# Patient Record
Sex: Female | Born: 1960 | Race: White | Hispanic: No | Marital: Married | Smoking: Never smoker
Health system: Southern US, Community
[De-identification: ages and names within clinical notes are randomized; demographics above are authoritative.]

## PROBLEM LIST (undated history)

## (undated) DIAGNOSIS — M810 Age-related osteoporosis without current pathological fracture: Secondary | ICD-10-CM

## (undated) DIAGNOSIS — N951 Menopausal and female climacteric states: Secondary | ICD-10-CM

## (undated) DIAGNOSIS — R011 Cardiac murmur, unspecified: Secondary | ICD-10-CM

## (undated) HISTORY — DX: Cardiac murmur, unspecified: R01.1

## (undated) HISTORY — DX: Menopausal and female climacteric states: N95.1

## (undated) HISTORY — DX: Age-related osteoporosis without current pathological fracture: M81.0

## (undated) HISTORY — PX: BREAST EXCISIONAL BIOPSY: SUR124

## (undated) HISTORY — PX: HYSTEROPLASTY REPAIR OF UTERINE ANOMALY: SUR663

## (undated) HISTORY — PX: COLONOSCOPY: SHX174

---

## 1987-01-12 HISTORY — PX: BREAST CYST EXCISION: SHX579

## 2003-05-13 ENCOUNTER — Other Ambulatory Visit: Admission: RE | Admit: 2003-05-13 | Discharge: 2003-05-13 | Payer: Self-pay | Admitting: Obstetrics and Gynecology

## 2003-05-17 ENCOUNTER — Encounter: Admission: RE | Admit: 2003-05-17 | Discharge: 2003-05-17 | Payer: Self-pay | Admitting: Obstetrics and Gynecology

## 2003-05-17 ENCOUNTER — Encounter (INDEPENDENT_AMBULATORY_CARE_PROVIDER_SITE_OTHER): Payer: Self-pay | Admitting: *Deleted

## 2003-07-11 ENCOUNTER — Ambulatory Visit (HOSPITAL_COMMUNITY): Admission: RE | Admit: 2003-07-11 | Discharge: 2003-07-11 | Payer: Self-pay | Admitting: Obstetrics and Gynecology

## 2003-07-11 ENCOUNTER — Encounter (INDEPENDENT_AMBULATORY_CARE_PROVIDER_SITE_OTHER): Payer: Self-pay | Admitting: Specialist

## 2004-01-12 HISTORY — PX: CERVICAL DISC ARTHROPLASTY: SHX587

## 2004-03-09 ENCOUNTER — Ambulatory Visit (HOSPITAL_COMMUNITY): Admission: RE | Admit: 2004-03-09 | Discharge: 2004-03-10 | Payer: Self-pay | Admitting: Neurosurgery

## 2004-06-04 ENCOUNTER — Encounter: Admission: RE | Admit: 2004-06-04 | Discharge: 2004-06-04 | Payer: Self-pay | Admitting: Obstetrics and Gynecology

## 2005-06-08 ENCOUNTER — Encounter: Admission: RE | Admit: 2005-06-08 | Discharge: 2005-06-08 | Payer: Self-pay | Admitting: Obstetrics and Gynecology

## 2005-12-11 HISTORY — PX: HYSTEROSCOPY: SHX211

## 2006-01-21 ENCOUNTER — Encounter: Admission: RE | Admit: 2006-01-21 | Discharge: 2006-01-21 | Payer: Self-pay | Admitting: Neurosurgery

## 2006-06-14 ENCOUNTER — Encounter: Admission: RE | Admit: 2006-06-14 | Discharge: 2006-06-14 | Payer: Self-pay | Admitting: Obstetrics and Gynecology

## 2006-12-13 ENCOUNTER — Ambulatory Visit (HOSPITAL_COMMUNITY): Admission: RE | Admit: 2006-12-13 | Discharge: 2006-12-13 | Payer: Self-pay | Admitting: Obstetrics and Gynecology

## 2006-12-13 ENCOUNTER — Encounter (INDEPENDENT_AMBULATORY_CARE_PROVIDER_SITE_OTHER): Payer: Self-pay | Admitting: Obstetrics and Gynecology

## 2007-01-12 HISTORY — PX: CERVICAL DISC ARTHROPLASTY: SHX587

## 2007-02-01 ENCOUNTER — Ambulatory Visit (HOSPITAL_COMMUNITY): Admission: RE | Admit: 2007-02-01 | Discharge: 2007-02-02 | Payer: Self-pay | Admitting: Neurosurgery

## 2007-06-16 ENCOUNTER — Encounter: Admission: RE | Admit: 2007-06-16 | Discharge: 2007-06-16 | Payer: Self-pay | Admitting: Obstetrics and Gynecology

## 2007-11-16 ENCOUNTER — Other Ambulatory Visit: Admission: RE | Admit: 2007-11-16 | Discharge: 2007-11-16 | Payer: Self-pay | Admitting: Obstetrics and Gynecology

## 2008-06-17 ENCOUNTER — Encounter: Admission: RE | Admit: 2008-06-17 | Discharge: 2008-06-17 | Payer: Self-pay | Admitting: Obstetrics and Gynecology

## 2008-09-27 ENCOUNTER — Encounter: Admission: RE | Admit: 2008-09-27 | Discharge: 2008-09-27 | Payer: Self-pay | Admitting: Neurosurgery

## 2009-02-24 DIAGNOSIS — M1990 Unspecified osteoarthritis, unspecified site: Secondary | ICD-10-CM | POA: Insufficient documentation

## 2009-04-22 ENCOUNTER — Encounter (INDEPENDENT_AMBULATORY_CARE_PROVIDER_SITE_OTHER): Payer: Self-pay | Admitting: *Deleted

## 2009-06-05 ENCOUNTER — Encounter (INDEPENDENT_AMBULATORY_CARE_PROVIDER_SITE_OTHER): Payer: Self-pay | Admitting: *Deleted

## 2009-06-10 ENCOUNTER — Ambulatory Visit: Payer: Self-pay | Admitting: Internal Medicine

## 2009-06-18 ENCOUNTER — Ambulatory Visit: Payer: Self-pay | Admitting: Internal Medicine

## 2009-06-22 ENCOUNTER — Encounter: Payer: Self-pay | Admitting: Internal Medicine

## 2009-06-24 ENCOUNTER — Encounter: Admission: RE | Admit: 2009-06-24 | Discharge: 2009-06-24 | Payer: Self-pay | Admitting: Obstetrics and Gynecology

## 2009-10-06 ENCOUNTER — Encounter: Admission: RE | Admit: 2009-10-06 | Discharge: 2009-10-06 | Payer: Self-pay | Admitting: Neurosurgery

## 2009-11-15 DIAGNOSIS — N951 Menopausal and female climacteric states: Secondary | ICD-10-CM

## 2009-11-15 HISTORY — DX: Menopausal and female climacteric states: N95.1

## 2010-02-10 NOTE — Letter (Signed)
Summary: Previsit letter  [facility]  [address] [place], [place] [address]   Phone: [phone]  Fax: [phone]       04/22/2009 MRN: [id number]  [facility] [address] [place], [place]  [address]  Dear [name],  Welcome to the Gastroenterology Division at [facility].    You are scheduled to see a nurse for your pre-procedure visit on 06-10-09 at 8:00a.m. on the 3rd floor at [facility], [address]  We ask that you try to arrive at our office 15 minutes prior to your appointment time to allow for check-in.  Your nurse visit will consist of discussing your medical and surgical history, your immediate family medical history, and your medications.    Please bring a complete list of all your medications or, if you prefer, bring the medication bottles and we will list them.  We will need to be aware of both prescribed and over the counter drugs.  We will need to know exact dosage information as well.  If you are on blood thinners (Coumadin, Plavix, Aggrenox, Ticlid, etc.) please call our office today/prior to your appointment, as we need to consult with your physician about holding your medication.   Please be prepared to read and sign documents such as consent forms, a financial agreement, and acknowledgement forms.  If necessary, and with your consent, a friend or relative is welcome to sit-in on the nurse visit with you.  Please bring your insurance card so that we may make a copy of it.  If your insurance requires a referral to see a specialist, please bring your referral form from your primary care physician.  No co-pay is required for this nurse visit.     If you cannot keep your appointment, please call [phone] to cancel or reschedule prior to your appointment date.  This allows [place] the opportunity to schedule an appointment for another patient in need of care.    Thank you for choosing [facility] for your medical  needs.  We appreciate the opportunity to care for you.  Please visit [place] at our website  to learn more about our practice.                     Sincerely.                                                                                                                   The Gastroenterology Division

## 2010-02-10 NOTE — Letter (Signed)
Summary: Moviprep Instructions  [facility]  [address]   [place], [place] [address]   Phone: [phone]  Fax: [phone]       [name]    03/06/2009    MRN: [id number]        Procedure Day Dorna Bloom: Wednesday, 06-18-09     Arrival Time: 7:30 a.m.     Procedure Time: 8:00 a.m.     Location of Procedure:                    x   [facility] (4th Floor)   PREPARATION FOR COLONOSCOPY WITH MOVIPREP   Starting 5 days prior to your procedure 51-3-11  do not eat nuts, seeds, popcorn, corn, beans, peas,  salads, or any raw vegetables.  Do not take any fiber supplements (e.g. Metamucil, Citrucel, and Benefiber).  THE DAY BEFORE YOUR PROCEDURE         DATE:  51-7-11   DAY: Tuesday  1.  Drink clear liquids the entire day-NO SOLID FOOD  2.  Do not drink anything colored red or purple.  Avoid juices with pulp.  No orange juice.  3.  Drink at least 64 oz. (8 glasses) of fluid/clear liquids during the day to prevent dehydration and help the prep work efficiently.  CLEAR LIQUIDS INCLUDE: Water Jello Ice Popsicles Tea (sugar ok, no milk/cream) Powdered fruit flavored drinks Coffee (sugar ok, no milk/cream) Gatorade Juice: apple, white grape, white cranberry  Lemonade Clear bullion, consomm, broth Carbonated beverages (any kind) Strained chicken noodle soup Hard Candy                             4.  In the morning, mix first dose of MoviPrep solution:    Empty 1 Pouch A and 1 Pouch B into the disposable container    Add lukewarm drinking water to the top line of the container. Mix to dissolve    Refrigerate (mixed solution should be used within 24 hrs)  5.  Begin drinking the prep at 5:00 p.m. The MoviPrep container is divided by 4 marks.   Every 15 minutes drink the solution down to the next mark (approximately 8 oz) until the full liter is complete.   6.  Follow completed prep with 16 oz of clear liquid of your choice (Nothing red or purple).   Continue to drink clear liquids until bedtime.  7.  Before going to bed, mix second dose of MoviPrep solution:    Empty 1 Pouch A and 1 Pouch B into the disposable container    Add lukewarm drinking water to the top line of the container. Mix to dissolve    Refrigerate  THE DAY OF YOUR PROCEDURE      DATE:  51-8-11  DAY: Wednesday  Beginning at 3:00 a.m. (5 hours before procedure):         1. Every 15 minutes, drink the solution down to the next mark (approx 8 oz) until the full liter is complete.  2. Follow completed prep with 16 oz. of clear liquid of your choice.    3. You may drink clear liquids until 6:00 a.m.  (2 HOURS BEFORE PROCEDURE).   MEDICATION INSTRUCTIONS  Unless otherwise instructed, you should take regular prescription medications with a small sip of water   as early as possible the morning of your procedure.        OTHER INSTRUCTIONS  You will need  a responsible adult at least 51 years of age to accompany you and drive you home.   This person must remain in the waiting room during your procedure.  Wear loose fitting clothing that is easily removed.  Leave jewelry and other valuables at home.  However, you may wish to bring a book to read or  an iPod/MP3 player to listen to music as you wait for your procedure to start.  Remove all body piercing jewelry and leave at home.  Total time from sign-in until discharge is approximately 2-3 hours.  You should go home directly after your procedure and rest.  You can resume normal activities the  day after your procedure.  The day of your procedure you should not:   Drive   Make legal decisions   Operate machinery   Drink alcohol   Return to work  You will receive specific instructions about eating, activities and medications before you leave.    The above instructions have been reviewed and explained to me by   Ezra Sites RN  Jun 10, 2009 8:12 AM    I fully understand and can verbalize these  instructions _____________________________ Date _________

## 2010-02-10 NOTE — Miscellaneous (Signed)
Summary: [facility]  Clinical Lists Changes  Medications: Added new medication of MOVIPREP 100 GM  SOLR (PEG-KCL-NACL-NASULF-NA ASC-C) As per prep instructions. - Signed Rx of MOVIPREP 100 GM  SOLR (PEG-KCL-NACL-NASULF-NA ASC-C) As per prep instructions.;  #1 x 0;  Signed;  Entered by: Ezra Sites RN;  Authorized by: Hilarie Fredrickson MD;  Method used: Electronically to [facility] # [phone]*, [address] [place], [place], [place]  [address], Ph: [phone], Fax: [phone] Allergies: Added new allergy or adverse reaction of * FINACEA Observations: Added new observation of NKA: F (06/10/2009 7:47)    Prescriptions: MOVIPREP 100 GM  SOLR (PEG-KCL-NACL-NASULF-NA ASC-C) As per prep instructions.  #1 x 0   Entered by:   Ezra Sites RN   Authorized by:   Hilarie Fredrickson MD   Signed by:   Ezra Sites RN on 06/10/2009   Method used:   Electronically to        [facility] [phone]* (retail)       [address] [place], [place]  [address]       Ph: [phone]       Fax: [phone]   RxID:   [id number]

## 2010-02-10 NOTE — Procedures (Signed)
Summary: Colonoscopy  Patient: [name] Note: All result statuses are Final unless otherwise noted.  Tests: (1) Colonoscopy (COL)   COL Colonoscopy           DONE     [facility]     [address]     [place], [place]  [address]           COLONOSCOPY PROCEDURE REPORT           PATIENT:  [name], [name]  MR#:  [id number]     BIRTHDATE:  December 09, 1960, 49 yrs. old  GENDER:  female     ENDOSCOPIST:  [name]. Eda Keys, MD     REF. BY:  [name], M.D.     PROCEDURE DATE:  06/18/2009     PROCEDURE:  Colonoscopy with snare polypectomy x 1     ASA CLASS:  Class I     INDICATIONS:  Routine Risk Screening     MEDICATIONS:   Fentanyl 100 mcg IV, Versed 10 mg IV           DESCRIPTION OF PROCEDURE:   After the risks benefits and     alternatives of the procedure were thoroughly explained, informed     consent was obtained.  Digital rectal exam was performed and     revealed no abnormalities.   The LB CF-H180AL [id number] endoscope     was introduced through the anus and advanced to the cecum, which     was identified by both the appendix and ileocecal valve, without     limitations.Time to cecum = 3:26 min. The quality of the prep was     excellent, using MoviPrep.  The instrument was then slowly     withdrawn (time = 11:26 min) as the colon was fully examined.     <<PROCEDUREIMAGES>>           FINDINGS:  A diminutive polyp was found in the cecum. Polyp was     snared without cautery. Retrieval was successful. snare polyp     This was otherwise a normal examination of the colon.   Retroflexed     views in the rectum revealed no abnormalities.    The scope was     then withdrawn from the patient and the procedure completed.           COMPLICATIONS:  None     ENDOSCOPIC IMPRESSION:     1) Diminutive polyp in the cecum - removed     2) Otherwise normal examination           RECOMMENDATIONS:     1) Repeat colonoscopy in 5 years if polyp adenomatous; otherwise     10 years       ______________________________     [name]. Eda Keys, MD           CC:  [name], MD; The Patient           n.     eSIGNED:   [name]. Eda Keys at 06/18/2009 08:44 AM           Lenise Herald, [id number]  Note: An exclamation mark (!) indicates a result that was not dispersed into the flowsheet. Document Creation Date: 06/18/2009 8:45 AM _______________________________________________________________________  (1) Order result status: Final Collection or observation date-time: 06/18/2009 08:35 Requested date-time:  Receipt date-time:  Reported date-time:  Referring Physician:   Ordering Physician: Fransico Setters [phone]) Specimen Source:  Source: Launa Grill Order Number: [id number]  Lab site:   Appended Document: Colonoscopy recall     Procedures Next Due Date:    Colonoscopy: 06/2019

## 2010-02-10 NOTE — Letter (Signed)
Summary: Patient Notice- Polyp Results  [facility]  [address] [place], [place] [address]   Phone: [phone]  Fax: [phone]        June 22, 2009 MRN: [id number]    [facility] [address] [place], [place]  [address]    Dear Ms. Picinich,  I am pleased to inform you that the colon polyp(s) removed during your recent colonoscopy was (were) found to be benign (no cancer detected) upon pathologic examination.  I recommend you have a repeat colonoscopy examination in 10 years to look for recurrent polyps, as having colon polyps increases your risk for having recurrent polyps or even colon cancer in the future.  Should you develop new or worsening symptoms of abdominal pain, bowel habit changes or bleeding from the rectum or bowels, please schedule an evaluation with either your primary care physician or with me.  Additional information/recommendations:  __ No further action with gastroenterology is needed at this time. Please      follow-up with your primary care physician for your other healthcare      needs.   Please call us if you are having persistent problems or have questions about your condition that have not been fully answered at this time.  Sincerely,  Hilarie Fredrickson MD  This letter has been electronically signed by your physician.  Appended Document: Patient Notice- Polyp Results letter mailed.

## 2010-05-15 DIAGNOSIS — G4700 Insomnia, unspecified: Secondary | ICD-10-CM | POA: Insufficient documentation

## 2010-05-15 DIAGNOSIS — M810 Age-related osteoporosis without current pathological fracture: Secondary | ICD-10-CM | POA: Insufficient documentation

## 2010-05-26 NOTE — Op Note (Signed)
[name], [name]               ACCOUNT NO.:  [id number]   MEDICAL RECORD NO.:  [id number]          PATIENT TYPE:  AMB   LOCATION:  SDC                           FACILITY:  [facility]   PHYSICIAN:  Lenoard Aden, M.D.DATE OF BIRTH:  1960/06/21   DATE OF PROCEDURE:  12/13/2006  DATE OF DISCHARGE:                               OPERATIVE REPORT   PREOPERATIVE DIAGNOSIS:  Perimenopausal dysfunctional  uterine bleeding  with probable structural lesion.   POSTOPERATIVE DIAGNOSIS:  Multiple endometrial polyps.   PROCEDURE:  1. Diagnostic hysteroscopy resectoscope with multiple polypectomy.  2. Dilatation and curettage.   SURGEON:  Lenoard Aden, M.D.   ANESTHESIA:  General.   BLOOD LOSS:  Less than 50 mL.   COMPLICATIONS:  None.   DRAINS:  Foley.   COUNTS:  Correct.   CONDITION:  Recovery in good condition.   FLUID DEFICIT:  100 mL.   DESCRIPTION OF PROCEDURE:  After being apprised of the risks of  anesthesia, infection, bleeding, uterine perforation, possible need for  repair, the patient brought to the operating room.  She was administered  general anesthetic without complications, prepped and draped in sterile  fashion,  catheterized until the bladder was empty.  Exam under  anesthesia reveals a small anteflexed uterus.  No adnexal masses.  Dilute Pitressin solution, 2700 mL, was placed, 16 mL total at the  cervicovaginal junction at 9 and 3 o'clock in the standard fashion and  then a paracervical block was placed using 1% Nesacaine solution at 4  and 8 o'clock in standard fashion.  Cervix is easily dilated up to a #27  Pratt dilator.  Hysteroscope placed.  Visualization reveals multiple  polyps along the anterior and posterior wall in addition to some pseudo  polypoid areas along the left lateral area.  The resectoscope was  placed.  A right-angle loop is used to resect all areas completely with  good hemostasis.  Bilateral normal tubal ostia noted.  Normal uterine  cavity is noted.  Dilatation and curettage was performed using sharp  curettage in the four- quadrant  method.  Good hemostasis noted.  Revisualization reveals no evidence of  further polypoid growth.  No bleeding is noted.  The patient all  instruments were removed.  The patient tolerates the procedure well and  is transferred to recovery in good condition,      Lenoard Aden, M.D.  Electronically Signed     RJT/MEDQ  D:  12/13/2006  T:  12/13/2006  Job:  [id number]

## 2010-05-26 NOTE — Op Note (Signed)
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   PROCEDURE:  C6-7 anterior cervical decompression and arthrodesis with  Allograft and Tether cervical plating.   SURGEON:  Hewitt Shorts, M.D.   ASSISTANT:  Nelia Shi. Webb Silversmith, N.P., and Stefani Dama, M.D.   ANESTHESIA:  General endotracheal.   INDICATIONS:  The patient is a 50 year old woman who is nearly 3 years  status post a 2-level C5-6 and C6-7 anterior cervical discectomy and  arthrodesis with Allograft and Tether cervical plating.  She did well  but then began to develop neck and right upper extremity radicular pain.  X-rays and CT showed excellent fusion at the C5-6 level but a nonunion  at C6-7 and because of persistent symptoms, a decision was made to  proceed with revision of the arthrodesis at C6-7.   PROCEDURE:  The patient was brought to the operating room and placed  under general endotracheal anesthesia.  The patient was placed in 10  pounds of Holter traction.  The neck was then prepped with Betadine soap  and solution and draped in a sterile fashion.  The previous incision was  marked and the underlying subcutaneous tissues were infiltrated with  local anesthetic with epinephrine, and the previous incision was  reopened.  Then, dissection was carried down through the subcutaneous  tissue and platysma.  Bipolar cautery was used to maintain hemostasis.  Dissection was then carried out through an  avascular plane leaving the  sternocleidomastoid, carotid artery, and jugular vein laterally, and  trachea and esophagus medially.  The ventral aspect of the vertebral  column was identified.  The previous plate was identified.  We carefully  dissected the overlying scar tissue exposing the plate.  We removed the  screws and plate and identified the pseudoarthrosis at C6-7.  The  microscope was draped and brought into the field to provide additional  navigation, illumination, and visualization.  The remainder of the  decompression was performed using microdissection and microsurgical  technique.  The scar tissue from the anterior aspects of the vertebral  bodies at C6 and C7 was removed down to the bony surface.  We then  entered into the disc space; there was extensive scar tissue.  This was  carefully removed with microcurettes and the X-Max drill.  We distracted  the disc space and continued to remove the pseudoarthrosis including  scar and bone and exposed down to the bony surfaces of C6 and C7.  Dissection was carried out posteriorly.  There was spondylitic  overgrowth posteriorly.  This was carefully removed with a 2-mm Kerrison  punch with a thin footplate.  We were  able to dissect and remove the  scar tissue from the ventral aspect of the spinal canal exposing and  decompressing the thecal sac, and then our attention was turned to the  neural foramen, which were decompressed bilaterally, and all the scar  tissue was removed, and the C7 nerve root was decompressed within the  neural foramen.  The wound was irrigated with bacitracin solution.  Hemostasis was established with the use of Gelfoam soaked in thrombin.  We measured the height of the intervertebral disc space.  We selected a  7-mm allograft implant.  The Gelfoam was removed.  Hemostasis was  confirmed and then we carefully positioned the intravertebral graft, and  it was countersunk.  We then took [employer]aTech ABX Putty, a  synthetic bone  graft and packed it around the lateral aspects of the structural  allograft.  Then, we selected a 14-mm Tether cervical plate that was  positioned over the fusion construct, the cervical traction was  discontinued.  We used 4.5- x 13-mm screws in the C7 and 4.0- x 13-mm  screws in C6.  We used the old screw holes at C7.  The plate was  positioned over the construct, and the 4.5-mm screws were placed at C7.  We then drilled screw holes at C6 and placed a 4- x 13-mm screws at that  level.  Variable-angle screws were used at both levels.  All 4 screws  were tightened with a final tightening once they were placed.  An x-ray  was taken, which showed the graft, plate, and screws in good position.  The alignment was good.  The wound was irrigated with bacitracin  solution and checked for hemostasis, which was confirmed and then we  proceeded with closure.  The platysma was closed with interrupted  inverted 2-0 running Vicryl sutures.  Subcutaneous and subcuticular  layer closed with interrupted inverted 3-0 running Vicryl sutures.  The  skin was reapproximated with Dermabond.  The procedure was tolerated  well.  The estimated blood loss was less than 25 mL.  Sponge and needle  counts were correct.  Following the surgery, the patient was placed in  an Aspen cervical collar to be reversed from the anesthetic, extubated,  and transferred to the recovery room for further care.      Hewitt Shorts, M.D.  Electronically Signed     RWN/MEDQ  D:  02/01/2007  T:  02/01/2007  Job:  [id number]

## 2010-05-27 ENCOUNTER — Other Ambulatory Visit: Payer: Self-pay | Admitting: Obstetrics and Gynecology

## 2010-05-27 DIAGNOSIS — Z1231 Encounter for screening mammogram for malignant neoplasm of breast: Secondary | ICD-10-CM

## 2010-05-29 NOTE — H&P (Signed)
NAME:  [name], [name]                         ACCOUNT NO.:  [id number]   MEDICAL RECORD NO.:  [id number]                   PATIENT TYPE:  AMB   LOCATION:  SDC                                  [facility]   PHYSICIAN:  Richardean Sale, M.D.                DATE OF BIRTH:  Oct 30, 1960   DATE OF ADMISSION:  DATE OF DISCHARGE:                                HISTORY & PHYSICAL   DATE OF PLANNED PROCEDURE:  July 11, 2003   CHIEF COMPLAINT:  Menorrhagia.   HISTORY OF PRESENT ILLNESS:  This is a 50 year old white female gravida 1  para 1 who has a long history of heavy menses that last approximately 6  days.  She has cramping and they are significantly heavy.  She also has  intermenstrual spotting.  She underwent ultrasound which revealed a normally-  sized uterus.  However, the endometrial lining was 0.57 cm with an  identifiable echogenic mass.  Both ovaries were within normal limits and  there is no cul-de-sac fluid.  She presents today for hysteroscopy D&C,  possible polypectomy.  She does have a history of anemia secondary to heavy  menses.  Last hemoglobin was 12.9 in May 2005.  She has had coagulation  studies drawn and those are normal.   PAST MEDICAL HISTORY:  1. Anemia.  2. Heart murmur which does not require antibiotics during procedures.  3. Thyroid cyst.   SURGICAL HISTORY:  1. Foot surgery.  2. Right breast cyst removal.  3. Right breast biopsy for fibroadenoma.  4. Removal of benign growth above right eye.   MEDICATIONS:  Multivitamin and iron, Finacea, and Retinae.   ALLERGIES:  None.   SOCIAL HISTORY:  No tobacco, rare alcohol, no drugs.   FAMILY HISTORY:  Mother has thyroid disease.  Father has heart disease,  hypertension.  Two brothers have hypertension.  She has a sister with  diabetes.  No history of breast, ovarian, uterine, or colon cancer.   PHYSICAL EXAMINATION:  VITAL SIGNS:  She is afebrile and vital signs are  stable.  GENERAL:  She is a thin  white female who is in no apparent distress who  appears her stated age.  NECK:  Neck and thyroid are within normal limits.  HEART:  Regular rate and rhythm.  No murmurs, rubs, or gallops auscultated  today.  LUNGS:  Clear to auscultation bilaterally.  ABDOMEN:  Soft, nontender, nondistended, with no palpable masses.  NEUROLOGIC:  She is oriented to person, place, and time.  Mood and affect  are normal.  BREAST:  There is palpable pea-sized nodule in the right upper outer  quadrant.  This was biopsied and was consistent with fibroadenoma.  Breasts  otherwise within normal limits.  PELVIC:  External genitalia are normal.  Vagina:  Pink, moist, and rugated.  Cervix visualized:  No masses, no cervical motion tenderness.  Uterus is  retroverted, nontender, no masses.  Adnexa:  No masses, nontender.  Urethra,  urethral meatus, bladder, and anus all within normal limits.   Pelvic ultrasound:  Uterus measured 9.45 x 4.32 x 6.20 cm.  No myometrial  masses are noted.  Endometrium is 0.57 cm with a hyperechoic lesion at the  fundus measuring 0.88 x 0.42 cm.  The right ovary is 3.16 x 1.48 x 2.93 cm.  The left ovary is 3.78 x 1.88 x 3.32 cm with no masses.  No cul-de-sac fluid  noted.   LABORATORY STUDIES:  Fibrinogen 215, PTT 26, INR 1.1, prothrombin time 10.8.  Pap smear from May 2005 within normal limits.   ASSESSMENT:  A 50 year old gravida 1 para 1 white female with intermenstrual  spotting, heavy menses, and endometrial polyp identified on ultrasound.   PLAN:  Will proceed with hysteroscopy D&C, possible polypectomy.  The risks  of the procedure including but not limited to hemorrhage requiring  transfusion, uterine perforation requiring additional exploratory surgery -  either via laparoscopy or laparotomy, possible uterine repair, hysterectomy,  injury to the bowel or bladder which would require further surgery,  infection which could prolong her hospitalization or require  intravenous  antibiotic therapy, anesthetic-related complications, and even death.  The  patient voices an understanding of all these risks and agrees to proceed.  Information on hysteroscopy D&C has been given to the patient for her to  review.  Consent has been obtained.                                               Richardean Sale, M.D.    JW/MEDQ  D:  07/10/2003  T:  07/10/2003  Job:  [id number]

## 2010-05-29 NOTE — Op Note (Signed)
[name], [name]               ACCOUNT NO.:  [id number]   MEDICAL RECORD NO.:  [id number]          PATIENT TYPE:  OIB   LOCATION:  NA                           FACILITY:  [facility]   PHYSICIAN:  Hewitt Shorts, M.D.DATE OF BIRTH:  11-19-1960   DATE OF PROCEDURE:  03/09/2004  DATE OF DISCHARGE:                                 OPERATIVE REPORT   PREOPERATIVE DIAGNOSES:  1.  C5-6 and C6-7 cervical disc herniation.  2.  Spondylosis.  3.  Degenerative disc disease.  4.  Radiculopathy.   POSTOPERATIVE DIAGNOSES:  1.  C5-6 and C6-7 cervical disc herniation.  2.  Spondylosis.  3.  Degenerative disc disease.  4.  Radiculopathy.   PROCEDURE:  C5-6 and C6-7 anterior cervical discectomy and arthrodesis with  VG2 allograft and tether cervical plating.   SURGEON:  Hewitt Shorts, M.D.   ASSISTANT:  Stefani Dama, M.D.   ANESTHESIA:  General endotracheal anesthesia.   INDICATIONS FOR PROCEDURE:  Patient is a 50 year old woman who presented  with a left C7 cervical radiculopathy with left triceps weakness and is  found to have central left C6-7 cervical disc herniation, superimposed upon  degenerative disc disease and spondylosis with spondylitic disc herniation  at C5-6 with underlying spondylosis and degenerative disc disease.  Decision  was made to proceed with elective two level anterior cervical discectomy and  arthrodesis.   DESCRIPTION OF PROCEDURE:  Patient brought to the operating room and placed  under general endotracheal anesthesia.  Patient was placed in 10 pounds of  Halter traction and neck was prepped with Betadine soap and solution and  draped in sterile fashion.  A horizontal incision was made in the left side  of the neck. The line of incision was infiltrated with local anesthetic with  epinephrine.  Attention was carried down through the subcutaneous tissue and  platysma.  Bipolar cautery and electrocautery used to maintain hemostasis.  Incision was then  carried out through an avascular plane in the  sternocleidomastoid, carotid artery and jugular vein laterally and trachea  and esophagus medially.  The ventral aspect of the vertebral column was  identified and a localizing x-ray was taken.  The central C5-6 and 6-7  intravertebral disc was identified.  Discectomy was begun with incision of  the annulus continuing with microcurettes and pituitary rongeurs.  Ventral  osteophytic overgrowth was removed using osteophyte removal tool as well as  Leksell rongeur.  The microscope was draped and brought into the field to  provide additional magnification, illumination and visualization and the  remainder of the decompression was performed using microdissection and  microsurgical technique.  The cartilaginous end plates of the corresponding  vertebral were removed using microcurettes along with the X Max drill and  then posterior osteophytic overgrowth was removed using X Max drill as well  as 2 mm Kerrison punch with a thin foot plate.  We removed the posterior  longitudinal ligament at each level on the left side at C6-7.  There was a  moderate sized disc herniation  extending out into the left C6-7 neural  foramen and  using a microhook, we were able to mobilize that fragment and  remove it and decompress the exiting left C7 nerve root.  In the end, good  decompression was achieved at both levels, both spinal canal and neural  foramen and exiting nerve roots as well as thecal sac and spinal cord.  Once  decompression was completed, hemostasis was established with the use of  Gelfoam soaked in thrombin and then we sized the disc spaces using bone  sizers and selected two 7 mm grafts, each was carefully positioned in the  intervertebral space and countersunk and then we selected a 29 mm tether  cervical plate is positioned over the fusion concert and secured to C5 and  C7 with a pair of 4 x 13 mm screws at the C6 with a 4.5 x 13 mm screw.  Each  of  the screw holes was drilled and tapped and the screws placed in  alternating fashion.  Once all five screws were in place, final tightening  was done and x-ray showed the grafts, plate and screws to be in good  position.  Overall alignment was good and then the wound was irrigated with  Bacitracin solution, checked for hemostasis which was established and  confirmed and then we proceeded with closure.  The platysma was closed with  interrupted inverted 2-0 undyed Vicryl sutures, the subcutaneous and  subcuticular were closed with inverted 3-0 undyed Vicryl sutures and the  skin was reapproximated with Dermabond.  The patient tolerated the procedure  well.   ESTIMATED BLOOD LOSS:  100 mL.   Sponge counts correct.   Following surgery, the traction was discontinued and the patient was placed  in a soft cervical collar.  She was reversed from anesthetic to be extubated  and transferred to the recovery room for further care.      RWN/MEDQ  D:  03/09/2004  T:  03/09/2004  Job:  [id number]

## 2010-05-29 NOTE — Op Note (Signed)
NAME:  [name], [name]                         ACCOUNT NO.:  [id number]   MEDICAL RECORD NO.:  [id number]                   PATIENT TYPE:  AMB   LOCATION:  SDC                                  FACILITY:  [facility]   PHYSICIAN:  Richardean Sale, M.D.                DATE OF BIRTH:  1960-06-19   DATE OF PROCEDURE:  07/11/2003  DATE OF DISCHARGE:                                 OPERATIVE REPORT   POSTOPERATIVE DIAGNOSES:  1. Menorrhagia.  2. Dysfunctional uterine bleeding.   POSTOPERATIVE DIAGNOSES:  1. Menorrhagia.  2. Dysfunctional uterine bleeding.  3. Multiple endometrial and endocervical polyps.   PROCEDURE:  Hysteroscopy, dilatation and curettage.   SURGEON:  Richardean Sale, M.D.   ANESTHESIA:  Conscious sedation with paracervical block.   ESTIMATED BLOOD LOSS:  Less than 50 mL.   FINDINGS:  Multiple endocervical and endometrial polyps.  Uterus sounded to  8.5 cm.   SPECIMENS:  Endometrial and endocervical curettings to pathology.   COMPLICATIONS:  None.   INDICATIONS:  This is a 50 year old white female, gravida 1, para 1, who has  a long history of heavy menses with intermenstrual bleeding.  Ultrasound  revealed endometrial stripe of 0.57 cm with an identifiable echogenic mass  consistent with a polyp.  The patient presents for a hysteroscopy, D&C.  Prior to the procedure, the risks were reviewed with the patient and  informed consent was obtained.  We discussed the risks of hemorrhage  requiring transfusion, uterine perforation requiring exploratory surgery  either via laparoscopy or laparotomy, possible injury to the bowel or  bladder or other intra-abdominal organs which would result in further  surgery, infection, anesthetic-related complications, and even death.  The  patient voiced an understanding of all these risks and agreed to proceed.  Informed consent was obtained and all questions were answered before  proceeding to the OR.   PROCEDURE:  The patient was  taken to the operating room, where she was given  conscious sedation.  She was then prepped and draped in the usual sterile  fashion and placed in the dorsal lithotomy position.  The bladder was  drained with a red rubber catheter.  A bimanual exam confirmed the presence  of a midline retroverted uterus with no palpable masses.  Adnexa had no  masses.  A speculum was then placed into the vagina and the cervix was  injected with 2 mL of 1% Nesacaine at the 12 o'clock position.  A single-  tooth tenaculum was then used to grasp the anterior lip of the cervix.  A  paracervical block was then administered using a total of 20 mL of 1%  Nesacaine.  The uterus was sounded to 8.5 cm.  The uterus was then very  carefully dilated with the Hegar dilators and the hysteroscope was then  introduced.  Findings revealed multiple endometrial and endocervical polyps.  The hysteroscope was then removed and fractional D&C was  performed.  Endocervical curettings were sent separately from endometrial curettings.  There were multiple endometrial polyps removed with curettage of the uterus.  Once curettage was complete, the hysteroscope was then introduced and the  uterine cavity was now free of any endometrial polyps and the cervical canal  was free of polyps as well.  The hysteroscope was removed.  The single-tooth  tenaculum was removed.  There was minimal bleeding noted from the cervix.  The speculum was removed and the patient was awakened from sedation.  All  sponge, lap, needle, and instrument counts were correct x2.  She was taken  to the recovery room awake, in stable condition.  Fluid deficit was 20 mL.                                               Richardean Sale, M.D.    JW/MEDQ  D:  07/11/2003  T:  07/11/2003  Job:  [id number]

## 2010-06-26 ENCOUNTER — Ambulatory Visit
Admission: RE | Admit: 2010-06-26 | Discharge: 2010-06-26 | Disposition: A | Discharge status: Home / Self Care | Payer: Managed Care, Other (non HMO) | Source: Ambulatory Visit | Attending: Obstetrics and Gynecology | Admitting: Obstetrics and Gynecology

## 2010-06-26 DIAGNOSIS — Z1231 Encounter for screening mammogram for malignant neoplasm of breast: Secondary | ICD-10-CM

## 2010-10-01 LAB — BASIC METABOLIC PANEL
BUN: 16
CO2: 29
Calcium: 9.5
Chloride: 104
Creatinine, Ser: 0.78
GFR calc Af Amer: >60
GFR calc non Af Amer: >60
Glucose, Bld: 90
Potassium: 4.3
Sodium: 138

## 2010-10-01 LAB — CBC
HCT: 38
Hemoglobin: 12.6
MCHC: 33.1
MCV: 82.4
Platelets: 301
RBC: 4.61
RDW: 16.1 — ABNORMAL HIGH
WBC: 6.2

## 2010-10-16 ENCOUNTER — Other Ambulatory Visit: Payer: Self-pay | Admitting: Neurosurgery

## 2010-10-16 DIAGNOSIS — IMO0002 Reserved for concepts with insufficient information to code with codable children: Secondary | ICD-10-CM

## 2010-10-19 LAB — CBC
Hemoglobin: 11.9 — ABNORMAL LOW
MCHC: 33.3
RBC: 4.5
RDW: 13.6

## 2010-10-19 LAB — HCG, SERUM, QUALITATIVE: Preg, Serum: NEGATIVE

## 2010-10-19 LAB — SAMPLE TO BLOOD BANK

## 2010-10-21 ENCOUNTER — Ambulatory Visit
Admission: RE | Admit: 2010-10-21 | Discharge: 2010-10-21 | Disposition: A | Discharge status: Home / Self Care | Payer: Managed Care, Other (non HMO) | Source: Ambulatory Visit | Attending: Neurosurgery | Admitting: Neurosurgery

## 2010-10-21 DIAGNOSIS — IMO0002 Reserved for concepts with insufficient information to code with codable children: Secondary | ICD-10-CM

## 2011-04-28 HISTORY — PX: ENDOMETRIAL BIOPSY: SHX622

## 2011-05-18 ENCOUNTER — Other Ambulatory Visit: Payer: Self-pay | Admitting: Obstetrics and Gynecology

## 2011-05-18 DIAGNOSIS — Z1231 Encounter for screening mammogram for malignant neoplasm of breast: Secondary | ICD-10-CM

## 2011-06-28 ENCOUNTER — Ambulatory Visit: Payer: Managed Care, Other (non HMO)

## 2011-06-29 ENCOUNTER — Ambulatory Visit
Admission: RE | Admit: 2011-06-29 | Discharge: 2011-06-29 | Disposition: A | Discharge status: Home / Self Care | Payer: Managed Care, Other (non HMO) | Source: Ambulatory Visit | Attending: Obstetrics and Gynecology | Admitting: Obstetrics and Gynecology

## 2011-06-29 DIAGNOSIS — Z1231 Encounter for screening mammogram for malignant neoplasm of breast: Secondary | ICD-10-CM

## 2012-03-14 ENCOUNTER — Encounter: Payer: Self-pay | Admitting: Nurse Practitioner

## 2012-04-17 ENCOUNTER — Encounter: Payer: Self-pay | Admitting: Nurse Practitioner

## 2012-04-17 ENCOUNTER — Ambulatory Visit (INDEPENDENT_AMBULATORY_CARE_PROVIDER_SITE_OTHER): Payer: BC Managed Care – PPO | Admitting: Nurse Practitioner

## 2012-04-17 VITALS — BP 100/64 | HR 70 | Resp 16 | Ht 64.0 in | Wt 135.0 lb

## 2012-04-17 DIAGNOSIS — Z780 Asymptomatic menopausal state: Secondary | ICD-10-CM

## 2012-04-17 DIAGNOSIS — Z01419 Encounter for gynecological examination (general) (routine) without abnormal findings: Secondary | ICD-10-CM

## 2012-04-17 NOTE — Progress Notes (Signed)
52 y.o. G2P1 Married Caucasian Fe here for annual exam.   Since last visit here 04/28/11 no further PMB.  Endo Biopsy revealed endo polyp without hyperplasia.   About 2 wk's ago started with insomnia doesn't feel they are always related to vaso symptoms as night.  She is very concerned about sleep med's so doesn't want Rx med's.  Still having hot flashes 10 -15 per day and they are tolerable.Does not want HRT.  If they occur  at night she tolerates with putting leg out of covers. Some vaginal dryness, using OTC lubrication.  No LMP recorded. Patient is postmenopausal.          Sexually active: yes  The current method of family planning is status post hysterectomy.    Exercising: yes  Home exercise routine includes stretching. Smoker:  no  Health Maintenance: Pap: 04/12/11 with normal results and neg HR HPV MMG: 06/27/11 normal with 3D Colonoscopy: 06/2009, polyp, recheck 5 yrs BMD:  2012 TDaP:  03/2008 Labs: none has PCP   reports that she has never smoked. She has never used smokeless tobacco. She reports that she drinks about 0.5 ounces of alcohol per week. She reports that she does not use illicit drugs.  Past Medical History  Diagnosis Date  . Menopausal state 11/15/2009    04/19/2010 FSH 119.0 No HRT    Past Surgical History  Procedure Laterality Date  . Breast cyst excision Right 1989    benign  . Cervical disc arthroplasty  2007    fusison of C6-C7 & C8  DDD of Cervical neck  . Cervical disc arthroplasty  2009    Refusion of Cervical neck C 6-8  . Hysteroplasty repair of uterine anomaly  12/2005 & 12/2006    removal of fibroids causing postmenopausal bleeding    Current Outpatient Prescriptions  Medication Sig Dispense Refill  . calcium carbonate 1250 MG capsule Take 1,250 mg by mouth 2 (two) times daily with a meal.       No current facility-administered medications for this visit.    Family History  Problem Relation Age of Onset  . Diabetes Sister   . Hypertension  Father   . Heart failure Father   . Hypertension Mother   . Osteoporosis Mother   . Thyroid disease Mother   . Hypertension Brother   . Hypertension Brother     ROS:  Pertinent items are noted in HPI.  Otherwise, a comprehensive ROS was negative.  Exam:   BP 100/64  Pulse 70  Resp 16  Ht 5\' 4"  (1.626 m)  Wt 135 lb (61.236 kg)  BMI 23.16 kg/m2  Breastfeeding? Unknown   Height: 5\' 4"  (162.6 cm)  Ht Readings from Last 3 Encounters:  04/17/12 5\' 4"  (1.626 m)    General appearance: alert, cooperative and appears stated age Head: Normocephalic, without obvious abnormality, atraumatic Neck: no adenopathy, supple, symmetrical, trachea midline and thyroid normal to inspection and palpation Lungs: clear to auscultation bilaterally Breasts: normal appearance, no masses or tenderness Heart: regular rate and rhythm Abdomen: soft, non-tender; no masses,  no organomegaly Extremities: extremities normal, atraumatic, no cyanosis or edema Skin: Skin color, texture, turgor normal. No rashes or lesions Lymph nodes: Cervical, supraclavicular, and axillary nodes normal. No abnormal inguinal nodes palpated Neurologic: Grossly normal   Pelvic: External genitalia:  no lesions              Urethra:  normal appearing urethra with no masses, tenderness or lesions  Bartholin's and Skene's: normal                 Vagina: normal appearing vagina with slight atrophic color changes and no discharge, no lesions              Cervix: anteverted              Pap taken: no Bimanual Exam:  Uterus:  normal size, contour, position, consistency, mobility, non-tender              Adnexa: no mass, fullness, tenderness               Rectovaginal: Confirms               Anus:  normal sphincter tone, no lesions  A:  Well Woman with normal exam  Postmenopausal Symptoms with Insomnia  History of PMB with endo polyps 4/13 no vaginal bleeding since    Previous H-Scopic resection of fibroids 12/07 &  12/08  P:   mammogram  pap smear as per guidelines  counseled on adequate intake of calcium and vitamin D,   diet and exercise,   Kegel's exercises  Call back if any vaginal bleeding  Advise OTC Melatonin or children's liquid Benadryl 12.5 mg  per tsp and adjust either up or down to help with insomnia.  Return annually or prn  An After Visit Summary was printed and given to the patient.

## 2012-04-17 NOTE — Patient Instructions (Addendum)

## 2012-04-19 NOTE — Progress Notes (Signed)
Encounter reviewed by Dr. Anselmo Reihl Silva.  

## 2012-05-26 ENCOUNTER — Other Ambulatory Visit: Payer: Self-pay

## 2012-05-26 DIAGNOSIS — Z1231 Encounter for screening mammogram for malignant neoplasm of breast: Secondary | ICD-10-CM

## 2012-07-04 ENCOUNTER — Ambulatory Visit
Admission: RE | Admit: 2012-07-04 | Discharge: 2012-07-04 | Disposition: A | Discharge status: Home / Self Care | Payer: BC Managed Care – PPO | Source: Ambulatory Visit

## 2012-07-04 DIAGNOSIS — Z1231 Encounter for screening mammogram for malignant neoplasm of breast: Secondary | ICD-10-CM

## 2012-11-16 ENCOUNTER — Other Ambulatory Visit: Payer: Self-pay

## 2013-04-23 ENCOUNTER — Ambulatory Visit: Payer: BC Managed Care – PPO | Admitting: Nurse Practitioner

## 2013-05-17 ENCOUNTER — Ambulatory Visit (INDEPENDENT_AMBULATORY_CARE_PROVIDER_SITE_OTHER): Payer: BC Managed Care – PPO | Admitting: Nurse Practitioner

## 2013-05-17 ENCOUNTER — Encounter: Payer: Self-pay | Admitting: Nurse Practitioner

## 2013-05-17 VITALS — BP 130/76 | HR 60 | Ht 63.75 in | Wt 135.0 lb

## 2013-05-17 DIAGNOSIS — Z780 Asymptomatic menopausal state: Secondary | ICD-10-CM

## 2013-05-17 DIAGNOSIS — M5030 Other cervical disc degeneration, unspecified cervical region: Secondary | ICD-10-CM

## 2013-05-17 DIAGNOSIS — Z01419 Encounter for gynecological examination (general) (routine) without abnormal findings: Secondary | ICD-10-CM

## 2013-05-17 NOTE — Patient Instructions (Signed)

## 2013-05-17 NOTE — Progress Notes (Signed)
Patient ID: Shelby EvenerLinda J Marsh, female   DOB: 27-Jan-1960, 53 y.o.   MRN: [id number] 53 y.o. G2P1 Married Caucasian Fe here for annual exam.  Still some vaso symptoms - most of the time tolerable. No further vaginal bleeding.  Had endo polyp with biopsy in 2013.  Patient's last menstrual period was 11/15/2009.          Sexually active: yes  The current method of family planning is post menopausal status.    Exercising: yes  Gym/ health club routine includes various machines, running and walking. Smoker:  no  Health Maintenance: Pap: 04/12/11 with normal results and neg HR HPV MMG: 07/04/12, 3D, Bi-Rads 1: negative Colonoscopy: 06/2009, polyp, recheck 5 yrs BMD:  2012 at PCP TDaP:  03/2008 Labs:  PCP, has apt tomorrow   reports that she has never smoked. She has never used smokeless tobacco. She reports that she drinks about .5 ounces of alcohol per week. She reports that she does not use illicit drugs.  Past Medical History  Diagnosis Date  . Menopausal state 11/15/2009    04/19/2010 FSH 119.0 No HRT    Past Surgical History  Procedure Laterality Date  . Breast cyst excision Right 1989    benign  . Cervical disc arthroplasty  2006    fusison of C6-C7 & C8  DDD of Cervical neck  . Cervical disc arthroplasty  2009    Refusion of Cervical neck C 6-8  . Hysteroplasty repair of uterine anomaly  12/2005 & 12/2006    removal of fibroids causing postmenopausal bleeding  . Hysteroscopy  12/07    Resection of polyp  . Endometrial biopsy  04/28/2011    endo polyp, no hyperplasia, History of PMB    No current outpatient prescriptions on file.   No current facility-administered medications for this visit.    Family History  Problem Relation Age of Onset  . Diabetes Sister   . Hypertension Father   . Heart failure Father   . Hypertension Mother   . Osteoporosis Mother   . Thyroid disease Mother   . Hypertension Brother   . Hypertension Brother     ROS:  Pertinent items are noted in HPI.   Otherwise, a comprehensive ROS was negative.  Exam:   BP 130/76  Pulse 60  Ht 5' 3.75" (1.619 m)  Wt 135 lb (61.236 kg)  BMI 23.36 kg/m2  LMP 11/15/2009  Breastfeeding? No Height: 5' 3.75" (161.9 cm)  Ht Readings from Last 3 Encounters:  05/17/13 5' 3.75" (1.619 m)  04/17/12 5\' 4"  (1.626 m)    General appearance: alert, cooperative and appears stated age Head: Normocephalic, without obvious abnormality, atraumatic Neck: no adenopathy, supple, symmetrical, trachea midline and thyroid normal to inspection and palpation Lungs: clear to auscultation bilateral ly Breasts: normal appearance, no masses or tenderness Heart: regular rate and rhythm Abdomen: soft, non-tender; no masses,  no organomegaly Extremities: extremities normal, atraumatic, no cyanosis or edema Skin: Skin color, texture, turgor normal. No rashes or lesions Lymph nodes: Cervical, supraclavicular, and axillary nodes normal. No abnormal inguinal nodes palpated Neurologic: Grossly normal   Pelvic: External genitalia:  no lesions              Urethra:  normal appearing urethra with no masses, tenderness or lesions              Bartholin's and Skene's: normal                 Vagina: normal appearing vagina  with normal color and discharge, no lesions              Cervix: anteverted              Pap taken: no Bimanual Exam:  Uterus:  normal size, contour, position, consistency, mobility, non-tender              Adnexa: no mass, fullness, tenderness               Rectovaginal: Confirms               Anus:  normal sphincter tone, no lesions  A:  Well Woman with normal exam  Postmenopausal no HRT  S/P endo biopsy for polyp as cause of PMB 04/2011 - no hyperplasia  Cervical neck DDD  P:   Reviewed health and wellness pertinent to exam  Pap smear not taken today  Mammogram due 6/15  Counseled on breast self exam, mammography screening, adequate intake of calcium and vitamin D, diet and exercise return annually or  prn  An After Visit Summary was printed and given to the patient.

## 2013-05-20 NOTE — Progress Notes (Signed)
Encounter reviewed by Dr. Itai Barbian Silva.  

## 2013-06-01 ENCOUNTER — Other Ambulatory Visit: Payer: Self-pay

## 2013-06-01 DIAGNOSIS — Z1231 Encounter for screening mammogram for malignant neoplasm of breast: Secondary | ICD-10-CM

## 2013-07-17 ENCOUNTER — Ambulatory Visit
Admission: RE | Admit: 2013-07-17 | Discharge: 2013-07-17 | Disposition: A | Discharge status: Home / Self Care | Payer: BC Managed Care – PPO | Source: Ambulatory Visit

## 2013-07-17 DIAGNOSIS — Z1231 Encounter for screening mammogram for malignant neoplasm of breast: Secondary | ICD-10-CM

## 2013-11-12 ENCOUNTER — Encounter: Payer: Self-pay | Admitting: Nurse Practitioner

## 2014-05-27 ENCOUNTER — Ambulatory Visit: Payer: BC Managed Care – PPO | Admitting: Nurse Practitioner

## 2014-06-04 ENCOUNTER — Ambulatory Visit (INDEPENDENT_AMBULATORY_CARE_PROVIDER_SITE_OTHER): Payer: BLUE CROSS/BLUE SHIELD | Admitting: Nurse Practitioner

## 2014-06-04 ENCOUNTER — Encounter: Payer: Self-pay | Admitting: Nurse Practitioner

## 2014-06-04 VITALS — BP 120/78 | HR 64 | Resp 14 | Ht 63.25 in | Wt 138.0 lb

## 2014-06-04 DIAGNOSIS — Z0000 Encounter for general adult medical examination without abnormal findings: Secondary | ICD-10-CM

## 2014-06-04 DIAGNOSIS — Z01419 Encounter for gynecological examination (general) (routine) without abnormal findings: Secondary | ICD-10-CM

## 2014-06-04 LAB — POCT URINALYSIS DIPSTICK
BILIRUBIN UA: NEGATIVE
GLUCOSE UA: NEGATIVE
KETONES UA: NEGATIVE
Leukocytes, UA: NEGATIVE
Nitrite, UA: NEGATIVE
PH UA: 6.5
Protein, UA: NEGATIVE
RBC UA: NEGATIVE
Spec Grav, UA: 1.01
UROBILINOGEN UA: NEGATIVE

## 2014-06-04 NOTE — Progress Notes (Signed)
Patient ID: Shelby EvenerLinda J Burzynski, female   DOB: 06-15-1960, 54 y.o.   MRN: [id number] 54 y.o. G2P1 Married  Caucasian Fe here for annual exam.  Has trigger thumb on the right. No other new problems.    Patient's last menstrual period was 11/15/2009.          Sexually active: Yes.    The current method of family planning is post menopausal status.    Exercising: YES  walking and strength training  Smoker:  no  Health Maintenance: Pap:  04/12/2011 normal with HR HPV neg MMG: 07-17-13 WNL BI Rads 1 Colonoscopy:  06/18/2009 hyperplastic polyp repeat in 10 years- IFOB is given yearly at PCP BMD:   06-2013 per patient (Dr. Timothy Lassousso PCP) - Osteopenia - followed by PCP may repeat in June TDaP:  03/2008 Labs:PCP   reports that she has never smoked. She has never used smokeless tobacco. She reports that she drinks about 0.5 oz of alcohol per week. She reports that she does not use illicit drugs.  Past Medical History  Diagnosis Date  . Menopausal state 11/15/2009    04/19/2010 FSH 119.0 No HRT    Past Surgical History  Procedure Laterality Date  . Breast cyst excision Right 1989    benign  . Cervical disc arthroplasty  2006    fusison of C6-C7 & C8  DDD of Cervical neck  . Cervical disc arthroplasty  2009    Refusion of Cervical neck C 6-8  . Hysteroplasty repair of uterine anomaly  12/2005 & 12/2006    removal of fibroids causing postmenopausal bleeding  . Hysteroscopy  12/07    Resection of polyp  . Endometrial biopsy  04/28/2011    endo polyp, no hyperplasia, History of PMB    Current Outpatient Prescriptions  Medication Sig Dispense Refill  . Evening Primrose Oil 1000 MG CAPS Take by mouth.    . Magnesium 400 MG TABS Take 600 mg by mouth daily.    . Multiple Minerals-Vitamins (CALCIUM CITRATE PLUS PO) Take 400 mg by mouth daily.    Marland Kitchen. pyridOXINE (VITAMIN B-6) 100 MG tablet Take 100 mg by mouth daily.     No current facility-administered medications for this visit.    Family History  Problem  Relation Age of Onset  . Hypertension Father   . Heart failure Father   . Hypertension Mother   . Osteoporosis Mother   . Thyroid disease Mother   . Hypertension Brother     alive - COPD  . Heart attack Brother [id number]    MI and HTN  . Diabetes Sister     ROS:  Pertinent items are noted in HPI.  Otherwise, a comprehensive ROS was negative.  Exam:   BP 120/78 mmHg  Pulse 64  Resp 14  Ht 5' 3.25" (1.607 m)  Wt 138 lb (62.596 kg)  BMI 24.24 kg/m2  LMP 11/15/2009 Height: 5' 3.25" (160.7 cm) Ht Readings from Last 3 Encounters:  06/04/14 5' 3.25" (1.607 m)  05/17/13 5' 3.75" (1.619 m)  04/17/12 5\' 4"  (1.626 m)    General appearance: alert, cooperative and appears stated age Head: Normocephalic, without obvious abnormality, atraumatic Neck: no adenopathy, supple, symmetrical, trachea midline and thyroid normal to inspection and palpation Lungs: clear to auscultation bilaterally Breasts: normal appearance, no masses or tenderness Heart: regular rate and rhythm Abdomen: soft, non-tender; no masses,  no organomegaly Extremities: extremities normal, atraumatic, no cyanosis or edema Skin: Skin color, texture, turgor normal. No rashes or lesions Lymph  nodes: Cervical, supraclavicular, and axillary nodes normal. No abnormal inguinal nodes palpated Neurologic: Grossly normal   Pelvic: External genitalia:  no lesions              Urethra:  normal appearing urethra with no masses, tenderness or lesions              Bartholin's and Skene's: normal                 Vagina: normal appearing vagina with normal color and discharge, no lesions              Cervix: anteverted              Pap taken: Yes.   Bimanual Exam:  Uterus:  normal size, contour, position, consistency, mobility, non-tender              Adnexa: no mass, fullness, tenderness               Rectovaginal: Confirms               Anus:  normal sphincter tone, no lesions  Chaperone present: Yes  A:  Well Woman with normal  exam  Postmenopausal no HRT S/P endo biopsy for polyp as cause of PMB 04/2011 - no hyperplasia Cervical neck DDD  P:   Reviewed health and wellness pertinent to exam  Pap smear as above  Mammogram is due 7/16  Counseled on breast self exam, mammography screening, adequate intake of calcium and vitamin D, diet and exercise return annually or prn  An After Visit Summary was printed and given to the patient.

## 2014-06-04 NOTE — Patient Instructions (Signed)

## 2014-06-06 LAB — IPS PAP TEST WITH HPV

## 2014-06-09 NOTE — Progress Notes (Signed)
Encounter reviewed by Dr. Brook Silva.  

## 2014-06-14 ENCOUNTER — Other Ambulatory Visit: Payer: Self-pay

## 2014-06-14 DIAGNOSIS — Z1231 Encounter for screening mammogram for malignant neoplasm of breast: Secondary | ICD-10-CM

## 2014-07-22 ENCOUNTER — Encounter: Payer: Self-pay | Admitting: Internal Medicine

## 2014-08-02 ENCOUNTER — Ambulatory Visit
Admission: RE | Admit: 2014-08-02 | Discharge: 2014-08-02 | Disposition: A | Discharge status: Home / Self Care | Payer: 59 | Source: Ambulatory Visit

## 2014-08-02 DIAGNOSIS — Z1231 Encounter for screening mammogram for malignant neoplasm of breast: Secondary | ICD-10-CM

## 2014-12-11 DIAGNOSIS — H60501 Unspecified acute noninfective otitis externa, right ear: Secondary | ICD-10-CM | POA: Insufficient documentation

## 2014-12-11 DIAGNOSIS — H6120 Impacted cerumen, unspecified ear: Secondary | ICD-10-CM | POA: Insufficient documentation

## 2015-06-10 ENCOUNTER — Ambulatory Visit (INDEPENDENT_AMBULATORY_CARE_PROVIDER_SITE_OTHER): Payer: 59 | Admitting: Nurse Practitioner

## 2015-06-10 ENCOUNTER — Encounter: Payer: Self-pay | Admitting: Nurse Practitioner

## 2015-06-10 VITALS — BP 118/66 | HR 60 | Ht 63.0 in | Wt 141.0 lb

## 2015-06-10 DIAGNOSIS — Z0000 Encounter for general adult medical examination without abnormal findings: Secondary | ICD-10-CM

## 2015-06-10 DIAGNOSIS — Z01419 Encounter for gynecological examination (general) (routine) without abnormal findings: Secondary | ICD-10-CM | POA: Diagnosis not present

## 2015-06-10 LAB — HIV ANTIBODY (ROUTINE TESTING W REFLEX): HIV 1&2 Ab, 4th Generation: NONREACTIVE

## 2015-06-10 NOTE — Progress Notes (Signed)
Patient ID: Shelby EvenerLinda J Lesniewski, female   DOB: 16-Apr-1960, 55 y.o.   MRN: [id number]  55 y.o. G2P0001 Married  Caucasian Fe here for annual exam.  No new health problems.  Some vaso symptoms that are tolerable.  Some vaginal dryness and uses coconut oil prn.  [place] are going to [place] to visit friends this summer.  Patient's last menstrual period was 11/15/2009.          Sexually active: Yes.    The current method of family planning is post menopausal status.    Exercising: Yes.    spin classes, 1 hour exercise class, walking, personal training 1 x per week Smoker:  no  Health Maintenance: Pap: 06/04/14, negative with neg HR HPV MMG: 08/02/14, 3D, Bi-Rads 1: negative Colonoscopy: 06/2009, hyperplastic polyp, repeat in 10 yrs BMD: July 2016 at Dr. Timothy Lassousso office Osteopenia TDaP:  03/2008 Hep C and HIV: done today Labs: PCP takes care of all labs   reports that she has never smoked. She has never used smokeless tobacco. She reports that she drinks about 0.5 oz of alcohol per week. She reports that she does not use illicit drugs.  Past Medical History  Diagnosis Date  . Menopausal state 11/15/2009    04/19/2010 FSH 119.0 No HRT    Past Surgical History  Procedure Laterality Date  . Breast cyst excision Right 1989    benign  . Cervical disc arthroplasty  2006    fusison of C6-C7 & C8  DDD of Cervical neck  . Cervical disc arthroplasty  2009    Refusion of Cervical neck C 6-8  . Hysteroplasty repair of uterine anomaly  12/2005 & 12/2006    removal of fibroids causing postmenopausal bleeding  . Hysteroscopy  12/07    Resection of polyp  . Endometrial biopsy  04/28/2011    endo polyp, no hyperplasia, History of PMB    No current outpatient prescriptions on file.   No current facility-administered medications for this visit.    Family History  Problem Relation Age of Onset  . Hypertension Father   . Heart failure Father   . Hypertension Mother   . Osteoporosis Mother   . Thyroid  disease Mother   . Hypertension Brother     alive - COPD  . Heart attack Brother [phone]    MI and HTN  . Diabetes Sister     ROS:  Pertinent items are noted in HPI.  Otherwise, a comprehensive ROS was negative.  Exam:   BP 118/66 mmHg  Pulse 60  Ht 5\' 3"  (1.6 m)  Wt 141 lb (63.957 kg)  BMI 24.98 kg/m2  LMP 11/15/2009 Height: 5\' 3"  (160 cm) Ht Readings from Last 3 Encounters:  06/10/15 5\' 3"  (1.6 m)  06/04/14 5' 3.25" (1.607 m)  05/17/13 5' 3.75" (1.619 m)    General appearance: alert, cooperative and appears stated age Head: Normocephalic, without obvious abnormality, atraumatic Neck: no adenopathy, supple, symmetrical, trachea midline and thyroid normal to inspection and palpation Lungs: clear to auscultation bilaterally Breasts: normal appearance, no masses or tenderness Heart: regular rate and rhythm Abdomen: soft, non-tender; no masses,  no organomegaly Extremities: extremities normal, atraumatic, no cyanosis or edema Skin: Skin color, texture, turgor normal. No rashes or lesions Lymph nodes: Cervical, supraclavicular, and axillary nodes normal. No abnormal inguinal nodes palpated Neurologic: Grossly normal   Pelvic: External genitalia:  no lesions              Urethra:  normal appearing urethra  with no masses, tenderness or lesions              Bartholin's and Skene's: normal                 Vagina: normal appearing vagina with normal color and discharge, no lesions              Cervix: anteverted              Pap taken: No. Bimanual Exam:  Uterus:  normal size, contour, position, consistency, mobility, non-tender              Adnexa: no mass, fullness, tenderness               Rectovaginal: Confirms               Anus:  normal sphincter tone, no lesions  Chaperone present: no  A:  Well Woman with normal exam  Postmenopausal no HRT S/P endo biopsy for polyp as cause of PMB 04/2011 - no hyperplasia Cervical neck DDD  History of Osteopenia  and FMH of Osteoporosis   P:   Reviewed health and wellness pertinent to exam  Pap smear as above  Mammogram is due 07/2015  Will follow with labs  Counseled with risk of fracture due to Osteoporosis, well balanced diet and Vit D. return annually or prn  An After Visit Summary was printed and given to the patient.

## 2015-06-10 NOTE — Patient Instructions (Addendum)

## 2015-06-11 LAB — HEPATITIS C ANTIBODY: HCV Ab: NEGATIVE

## 2015-06-11 NOTE — Progress Notes (Signed)
Reviewed personally.  M. Suzanne Jonai Weyland, MD.  

## 2015-07-21 ENCOUNTER — Other Ambulatory Visit: Payer: Self-pay | Admitting: Internal Medicine

## 2015-07-21 DIAGNOSIS — Z1231 Encounter for screening mammogram for malignant neoplasm of breast: Secondary | ICD-10-CM

## 2015-08-05 ENCOUNTER — Ambulatory Visit
Admission: RE | Admit: 2015-08-05 | Discharge: 2015-08-05 | Disposition: A | Discharge status: Home / Self Care | Payer: 59 | Source: Ambulatory Visit | Attending: Internal Medicine | Admitting: Internal Medicine

## 2015-08-05 DIAGNOSIS — Z1231 Encounter for screening mammogram for malignant neoplasm of breast: Secondary | ICD-10-CM

## 2016-06-11 ENCOUNTER — Ambulatory Visit: Payer: 59 | Admitting: Nurse Practitioner

## 2016-06-22 ENCOUNTER — Encounter: Payer: Self-pay | Admitting: Nurse Practitioner

## 2016-06-22 ENCOUNTER — Ambulatory Visit (INDEPENDENT_AMBULATORY_CARE_PROVIDER_SITE_OTHER): Payer: BLUE CROSS/BLUE SHIELD | Admitting: Nurse Practitioner

## 2016-06-22 VITALS — BP 108/66 | HR 60 | Ht 63.5 in | Wt 141.0 lb

## 2016-06-22 DIAGNOSIS — Z0000 Encounter for general adult medical examination without abnormal findings: Secondary | ICD-10-CM

## 2016-06-22 DIAGNOSIS — Z01419 Encounter for gynecological examination (general) (routine) without abnormal findings: Secondary | ICD-10-CM

## 2016-06-22 DIAGNOSIS — M859 Disorder of bone density and structure, unspecified: Secondary | ICD-10-CM | POA: Diagnosis not present

## 2016-06-22 NOTE — Progress Notes (Addendum)
Patient ID: Shelby EvenerLinda J Marsh, female   DOB: 1960/05/18, 56 y.o.   MRN: [id number]  56 y.o. 521P1001 Married  Caucasian Fe here for annual exam.  No new health problems.  Will be seeing PCP next week for AEX.  Patient's last menstrual period was 11/15/2009.          Sexually active: Yes.    The current method of family planning is post menopausal status.    Exercising: Yes.    Gym/ health club routine includes fitness classes with intense stretching, walking. Does some type of exercise at least one hour every day. Smoker:  no  Health Maintenance: Pap: 06/04/14, Negative with neg HR HPV  04/12/11, Negative with neg HR HPV History of Abnormal Pap: no MMG: 08/05/15, 3D-yes, Density Category C, Bi-Rads 1:  Negative Self Breast exams: yes Colonoscopy: 06/2009, hyperplastic polyp, repeat in 10 years BMD: July 2016 with Dr. Timothy Lassousso, osteopenia TDaP: 03/2008 Shingles: Not indicated due to age Pneumonia: Not indicated due to age Hep C and HIV: 06/10/15 Labs: Dr. Timothy Lassousso takes care of fasting labs   reports that she has never smoked. She has never used smokeless tobacco. She reports that she drinks about 0.5 oz of alcohol per week . She reports that she does not use drugs.  Past Medical History:  Diagnosis Date  . Menopausal state 11/15/2009   04/19/2010 FSH 119.0 No HRT    Past Surgical History:  Procedure Laterality Date  . BREAST CYST EXCISION Right 1989   benign  . CERVICAL DISC ARTHROPLASTY  2006   fusison of C6-C7 & C8  DDD of Cervical neck  . CERVICAL DISC ARTHROPLASTY  2009   Refusion of Cervical neck C 6-8  . ENDOMETRIAL BIOPSY  04/28/2011   endo polyp, no hyperplasia, History of PMB  . HYSTEROPLASTY REPAIR OF UTERINE ANOMALY  12/2005 & 12/2006   removal of fibroids causing postmenopausal bleeding  . HYSTEROSCOPY  12/07   Resection of polyp    Current Outpatient Prescriptions  Medication Sig Dispense Refill  . Biotin w/ Vitamins C & E (HAIR/SKIN/NAILS PO) Take 1 tablet by mouth 3 (three)  times daily.     No current facility-administered medications for this visit.     Family History  Problem Relation Age of Onset  . Hypertension Father   . Heart failure Father   . Hypertension Mother   . Osteoporosis Mother   . Thyroid disease Mother   . Hypertension Brother        alive - COPD  . Heart attack Brother [phone]       MI and HTN  . Diabetes Sister     ROS:  Pertinent items are noted in HPI.  Otherwise, a comprehensive ROS was negative.  Exam:   BP 108/66 (BP Location: Right Arm, Patient Position: Sitting, Cuff Size: Normal)   Pulse 60   Ht 5' 3.5" (1.613 m)   Wt 141 lb (64 kg)   LMP 11/15/2009   BMI 24.59 kg/m  Height: 5' 3.5" (161.3 cm) Ht Readings from Last 3 Encounters:  06/22/16 5' 3.5" (1.613 m)  06/10/15 5\' 3"  (1.6 m)  06/04/14 5' 3.25" (1.607 m)    General appearance: alert, cooperative and appears stated age Head: Normocephalic, without obvious abnormality, atraumatic Neck: no adenopathy, supple, symmetrical, trachea midline and thyroid normal to inspection and palpation Lungs: clear to auscultation bilaterally Breasts: normal appearance, no masses or tenderness Heart: regular rate and rhythm Abdomen: soft, non-tender; no masses,  no organomegaly Extremities:  extremities normal, atraumatic, no cyanosis or edema Skin: Skin color, texture, turgor normal. No rashes or lesions Lymph nodes: Cervical, supraclavicular, and axillary nodes normal. No abnormal inguinal nodes palpated Neurologic: Grossly normal   Pelvic: External genitalia:  no lesions              Urethra:  normal appearing urethra with no masses, tenderness or lesions              Bartholin's and Skene's: normal                 Vagina: normal appearing vagina with normal color and discharge, no lesions              Cervix: anteverted              Pap taken: No. Bimanual Exam:  Uterus:  normal size, contour, position, consistency, mobility, non-tender              Adnexa: no mass,  fullness, tenderness               Rectovaginal: Confirms               Anus:  normal sphincter tone, no lesions  Chaperone present: no  A:  Well Woman with normal exam  Postmenopausal - no HRT  H/O PMP bleeding with 4/13.  SHGM showed 10mm endometrium without lesions.  Biopsy negative except for polyp tissue.  Pt has withdrawal bleeding.  None since.    Osteopenia, mother with Osteoporosis     P:   Reviewed health and wellness pertinent to exam  Pap smear: no  Mammogram is due 07/2016  Counseled on breast self exam, mammography screening, adequate intake of calcium and vitamin D, diet and exercise return annually or prn ROI for BMD 7/16 from PCP  An After Visit Summary was printed and given to the patient.

## 2016-06-22 NOTE — Patient Instructions (Addendum)

## 2016-06-22 NOTE — Progress Notes (Signed)
Reviewed personally.  M. Suzanne Jennipher Weatherholtz, MD.  

## 2016-06-23 ENCOUNTER — Other Ambulatory Visit: Payer: Self-pay | Admitting: Internal Medicine

## 2016-06-23 DIAGNOSIS — Z1231 Encounter for screening mammogram for malignant neoplasm of breast: Secondary | ICD-10-CM

## 2016-06-29 DIAGNOSIS — Z1389 Encounter for screening for other disorder: Secondary | ICD-10-CM | POA: Diagnosis not present

## 2016-06-29 DIAGNOSIS — M5030 Other cervical disc degeneration, unspecified cervical region: Secondary | ICD-10-CM | POA: Diagnosis not present

## 2016-06-29 DIAGNOSIS — N951 Menopausal and female climacteric states: Secondary | ICD-10-CM | POA: Diagnosis not present

## 2016-06-29 DIAGNOSIS — Z0000 Encounter for general adult medical examination without abnormal findings: Secondary | ICD-10-CM | POA: Diagnosis not present

## 2016-06-29 DIAGNOSIS — G4709 Other insomnia: Secondary | ICD-10-CM | POA: Diagnosis not present

## 2016-06-29 DIAGNOSIS — M859 Disorder of bone density and structure, unspecified: Secondary | ICD-10-CM | POA: Diagnosis not present

## 2016-06-30 DIAGNOSIS — Z1212 Encounter for screening for malignant neoplasm of rectum: Secondary | ICD-10-CM | POA: Diagnosis not present

## 2016-08-10 ENCOUNTER — Ambulatory Visit
Admission: RE | Admit: 2016-08-10 | Discharge: 2016-08-10 | Disposition: A | Discharge status: Home / Self Care | Payer: BLUE CROSS/BLUE SHIELD | Source: Ambulatory Visit | Attending: Internal Medicine | Admitting: Internal Medicine

## 2016-08-10 DIAGNOSIS — Z1231 Encounter for screening mammogram for malignant neoplasm of breast: Secondary | ICD-10-CM

## 2016-08-17 ENCOUNTER — Telehealth: Payer: Self-pay | Admitting: Obstetrics & Gynecology

## 2016-08-17 ENCOUNTER — Encounter: Payer: Self-pay | Admitting: Certified Nurse Midwife

## 2016-08-17 DIAGNOSIS — M859 Disorder of bone density and structure, unspecified: Secondary | ICD-10-CM | POA: Diagnosis not present

## 2016-08-17 NOTE — Telephone Encounter (Signed)
Left message for patient to call to reschedule [name] appointment. °

## 2016-09-28 DIAGNOSIS — B078 Other viral warts: Secondary | ICD-10-CM | POA: Diagnosis not present

## 2016-09-28 DIAGNOSIS — L814 Other melanin hyperpigmentation: Secondary | ICD-10-CM | POA: Diagnosis not present

## 2016-09-28 DIAGNOSIS — D225 Melanocytic nevi of trunk: Secondary | ICD-10-CM | POA: Diagnosis not present

## 2016-09-28 DIAGNOSIS — D1800 Hemangioma unspecified site: Secondary | ICD-10-CM | POA: Diagnosis not present

## 2016-10-01 DIAGNOSIS — S90424A Blister (nonthermal), right lesser toe(s), initial encounter: Secondary | ICD-10-CM | POA: Diagnosis not present

## 2016-10-09 DIAGNOSIS — Z23 Encounter for immunization: Secondary | ICD-10-CM | POA: Diagnosis not present

## 2017-06-24 ENCOUNTER — Ambulatory Visit: Payer: BLUE CROSS/BLUE SHIELD | Admitting: Certified Nurse Midwife

## 2017-06-28 ENCOUNTER — Ambulatory Visit: Payer: BLUE CROSS/BLUE SHIELD | Admitting: Nurse Practitioner

## 2017-07-01 ENCOUNTER — Ambulatory Visit: Payer: BLUE CROSS/BLUE SHIELD | Admitting: Certified Nurse Midwife

## 2017-07-12 ENCOUNTER — Other Ambulatory Visit (HOSPITAL_COMMUNITY)
Admission: RE | Admit: 2017-07-12 | Discharge: 2017-07-12 | Disposition: A | Discharge status: Home / Self Care | Payer: BLUE CROSS/BLUE SHIELD | Source: Ambulatory Visit | Attending: Obstetrics & Gynecology | Admitting: Obstetrics & Gynecology

## 2017-07-12 ENCOUNTER — Encounter: Payer: Self-pay | Admitting: Certified Nurse Midwife

## 2017-07-12 ENCOUNTER — Ambulatory Visit: Payer: BLUE CROSS/BLUE SHIELD | Admitting: Certified Nurse Midwife

## 2017-07-12 ENCOUNTER — Other Ambulatory Visit: Payer: Self-pay

## 2017-07-12 VITALS — BP 120/80 | HR 64 | Resp 16 | Ht 63.25 in | Wt 142.0 lb

## 2017-07-12 DIAGNOSIS — Z8739 Personal history of other diseases of the musculoskeletal system and connective tissue: Secondary | ICD-10-CM

## 2017-07-12 DIAGNOSIS — Z124 Encounter for screening for malignant neoplasm of cervix: Secondary | ICD-10-CM | POA: Insufficient documentation

## 2017-07-12 DIAGNOSIS — Z01419 Encounter for gynecological examination (general) (routine) without abnormal findings: Secondary | ICD-10-CM

## 2017-07-12 NOTE — Progress Notes (Signed)
57 y.o. G69P1001 Married  Caucasian Fe here for annual exam. Menopausal no HRT. Denies vaginal bleeding or vaginal dryness. Has noted she has a wheat allergy and if avoids has normal stools 3 x week, now. Feels so much better than sometimes once monthly. Diagnosed with Osteoporosis at last BMD, now on Fosamax with PCP management. Sees Dr. Timothy Lasso yearly for aex/labs and Fosamax evaluation.  Having no problems with use. No other health issues today.  Patient's last menstrual period was 11/15/2009.          Sexually active: Yes.    The current method of family planning is post menopausal status.    Exercising: Yes.    class 3 times a week, walk daily Smoker:  no  Health Maintenance: Pap:  06-04-14 neg HPV HR neg History of Abnormal Pap: no MMG:  08-10-16 category c density birads 1:neg Self Breast exams: yes Colonoscopy:  6/11 hyperplastic polyp f/u 56yrs BMD:   2018 osteoporosis, Dr. Timothy Lasso management with Fosamax TDaP:  2010 Shingles: no Pneumonia: no Hep C and HIV: both neg 2017 Labs: if needed.   reports that she has never smoked. She has never used smokeless tobacco. She reports that she drinks alcohol. She reports that she does not use drugs.  Past Medical History:  Diagnosis Date  . Menopausal state 11/15/2009   04/19/2010 FSH 119.0 No HRT  . Osteoporosis     Past Surgical History:  Procedure Laterality Date  . BREAST CYST EXCISION Right 1989   benign  . BREAST EXCISIONAL BIOPSY Right   . CERVICAL DISC ARTHROPLASTY  2006   fusison of C6-C7 & C8  DDD of Cervical neck  . CERVICAL DISC ARTHROPLASTY  2009   Refusion of Cervical neck C 6-8  . ENDOMETRIAL BIOPSY  04/28/2011   endo polyp, no hyperplasia, History of PMB  . HYSTEROPLASTY REPAIR OF UTERINE ANOMALY  12/2005 & 12/2006   removal of fibroids causing postmenopausal bleeding  . HYSTEROSCOPY  12/07   Resection of polyp    Current Outpatient Medications  Medication Sig Dispense Refill  . alendronate (FOSAMAX) 70 MG tablet  TAKE 1 TABLET BY MOUTH EVERY WEEK WITH 240 MLS OF WATER 30 MINUTES PRIOR TO OTHER FOOD/DRINK OR MEDICINES  3   No current facility-administered medications for this visit.     Family History  Problem Relation Age of Onset  . Hypertension Father   . Heart failure Father   . Hypertension Mother   . Osteoporosis Mother   . Thyroid disease Mother   . Hypertension Brother        alive - COPD  . Heart attack Brother [phone]       MI and HTN  . Diabetes Sister     ROS:  Pertinent items are noted in HPI.  Otherwise, a comprehensive ROS was negative.  Exam:   BP 120/80   Pulse 64   Resp 16   Ht 5' 3.25" (1.607 m)   Wt 142 lb (64.4 kg)   LMP 11/15/2009   BMI 24.96 kg/m  Height: 5' 3.25" (160.7 cm) Ht Readings from Last 3 Encounters:  07/12/17 5' 3.25" (1.607 m)  06/22/16 5' 3.5" (1.613 m)  06/10/15 5\' 3"  (1.6 m)    General appearance: alert, cooperative and appears stated age Head: Normocephalic, without obvious abnormality, atraumatic Neck: no adenopathy, supple, symmetrical, trachea midline and thyroid normal to inspection and palpation Lungs: clear to auscultation bilaterally Breasts: normal appearance, no masses or tenderness, No nipple retraction or dimpling,  No nipple discharge or bleeding, No axillary or supraclavicular adenopathy Heart: regular rate and rhythm Abdomen: soft, non-tender; no masses,  no organomegaly Extremities: extremities normal, atraumatic, no cyanosis or edema Skin: Skin color, texture, turgor normal. No rashes or lesions Lymph nodes: Cervical, supraclavicular, and axillary nodes normal. No abnormal inguinal nodes palpated Neurologic: Grossly normal   Pelvic: External genitalia:  no lesions, normal female              Urethra:  normal appearing urethra with no masses, tenderness or lesions              Bartholin's and Skene's: normal                 Vagina:dry appearing vagina with normal color and scant discharge, no lesions              Cervix:  multiparous appearance, no cervical motion tenderness and no lesions              Pap taken: Yes.   Bimanual Exam:  Uterus:  normal size, contour, position, consistency, mobility, non-tender              Adnexa: normal adnexa and no mass, fullness, tenderness               Rectovaginal: Confirms               Anus:  normal sphincter tone, no lesions  Chaperone present: yes  A:  Well Woman with normal exam   Menopausal no HRT  Vaginal dryness  Osteoporosis with PCP management on Fosamax  Mammogram due will schedule this month    P:   Reviewed health and wellness pertinent to exam  Aware of need to advise if vaginal bleeding  Discussed vaginal dryness and etiology. Discussed options of OTC coconut oil or light virgin oil for use. Will advise if no change.  Continue follow up with PCP as indicated.  Pap smear: yes   counseled on breast self exam, mammography screening and be sure to schedule, osteoporosis, adequate intake of calcium and vitamin D, diet and exercise  return annually or prn  An After Visit Summary was printed and given to the patient.

## 2017-07-12 NOTE — Patient Instructions (Signed)

## 2017-07-14 LAB — CYTOLOGY - PAP
Diagnosis: NEGATIVE
HPV (WINDOPATH): NOT DETECTED

## 2017-07-18 ENCOUNTER — Other Ambulatory Visit: Payer: Self-pay | Admitting: Internal Medicine

## 2017-07-18 DIAGNOSIS — Z1231 Encounter for screening mammogram for malignant neoplasm of breast: Secondary | ICD-10-CM

## 2017-08-12 ENCOUNTER — Ambulatory Visit
Admission: RE | Admit: 2017-08-12 | Discharge: 2017-08-12 | Disposition: A | Discharge status: Home / Self Care | Payer: BLUE CROSS/BLUE SHIELD | Source: Ambulatory Visit | Attending: Internal Medicine | Admitting: Internal Medicine

## 2017-08-12 DIAGNOSIS — Z1231 Encounter for screening mammogram for malignant neoplasm of breast: Secondary | ICD-10-CM | POA: Diagnosis not present

## 2017-10-08 DIAGNOSIS — Z23 Encounter for immunization: Secondary | ICD-10-CM | POA: Diagnosis not present

## 2017-12-06 DIAGNOSIS — Z0000 Encounter for general adult medical examination without abnormal findings: Secondary | ICD-10-CM | POA: Diagnosis not present

## 2017-12-06 DIAGNOSIS — Z125 Encounter for screening for malignant neoplasm of prostate: Secondary | ICD-10-CM | POA: Diagnosis not present

## 2017-12-06 DIAGNOSIS — M859 Disorder of bone density and structure, unspecified: Secondary | ICD-10-CM | POA: Diagnosis not present

## 2017-12-13 DIAGNOSIS — K5900 Constipation, unspecified: Secondary | ICD-10-CM | POA: Insufficient documentation

## 2017-12-13 DIAGNOSIS — M810 Age-related osteoporosis without current pathological fracture: Secondary | ICD-10-CM | POA: Diagnosis not present

## 2017-12-13 DIAGNOSIS — N951 Menopausal and female climacteric states: Secondary | ICD-10-CM | POA: Diagnosis not present

## 2017-12-13 DIAGNOSIS — Z0000 Encounter for general adult medical examination without abnormal findings: Secondary | ICD-10-CM | POA: Diagnosis not present

## 2017-12-13 DIAGNOSIS — Z6825 Body mass index (BMI) 25.0-25.9, adult: Secondary | ICD-10-CM | POA: Diagnosis not present

## 2017-12-13 DIAGNOSIS — Z23 Encounter for immunization: Secondary | ICD-10-CM | POA: Diagnosis not present

## 2017-12-13 DIAGNOSIS — G4700 Insomnia, unspecified: Secondary | ICD-10-CM | POA: Diagnosis not present

## 2017-12-15 DIAGNOSIS — Z1212 Encounter for screening for malignant neoplasm of rectum: Secondary | ICD-10-CM | POA: Diagnosis not present

## 2018-07-07 ENCOUNTER — Other Ambulatory Visit: Payer: Self-pay | Admitting: Internal Medicine

## 2018-07-07 DIAGNOSIS — Z1231 Encounter for screening mammogram for malignant neoplasm of breast: Secondary | ICD-10-CM

## 2018-07-18 ENCOUNTER — Ambulatory Visit: Payer: BLUE CROSS/BLUE SHIELD | Admitting: Certified Nurse Midwife

## 2018-08-23 ENCOUNTER — Other Ambulatory Visit: Payer: Self-pay

## 2018-08-23 ENCOUNTER — Ambulatory Visit
Admission: RE | Admit: 2018-08-23 | Discharge: 2018-08-23 | Disposition: A | Discharge status: Home / Self Care | Payer: BC Managed Care – PPO | Source: Ambulatory Visit | Attending: Internal Medicine | Admitting: Internal Medicine

## 2018-08-23 DIAGNOSIS — Z1231 Encounter for screening mammogram for malignant neoplasm of breast: Secondary | ICD-10-CM | POA: Diagnosis not present

## 2018-08-31 DIAGNOSIS — R55 Syncope and collapse: Secondary | ICD-10-CM | POA: Diagnosis not present

## 2018-08-31 DIAGNOSIS — K5900 Constipation, unspecified: Secondary | ICD-10-CM | POA: Diagnosis not present

## 2018-08-31 DIAGNOSIS — R51 Headache: Secondary | ICD-10-CM | POA: Diagnosis not present

## 2018-08-31 DIAGNOSIS — R519 Headache, unspecified: Secondary | ICD-10-CM | POA: Insufficient documentation

## 2018-08-31 DIAGNOSIS — S060X9A Concussion with loss of consciousness of unspecified duration, initial encounter: Secondary | ICD-10-CM | POA: Diagnosis not present

## 2018-08-31 DIAGNOSIS — M542 Cervicalgia: Secondary | ICD-10-CM | POA: Insufficient documentation

## 2018-09-12 DIAGNOSIS — Z20818 Contact with and (suspected) exposure to other bacterial communicable diseases: Secondary | ICD-10-CM | POA: Diagnosis not present

## 2018-09-12 DIAGNOSIS — J029 Acute pharyngitis, unspecified: Secondary | ICD-10-CM | POA: Diagnosis not present

## 2018-09-24 NOTE — Progress Notes (Signed)
Patient referred by [name], MD for syncope  Subjective:   [name], female    DOB: 1960-04-13, 58 y.o.   MRN: [id number]   Chief Complaint  Patient presents with  . Loss of Consciousness  . New Patient (Initial Visit)     HPI  58 y.o. Caucasian female with recurrent episodes of presyncope/syncope, exertional dyspnea.  Patient's most recent episode of syncope occur few weeks ago.  Patient got up to go to the bathroom middle of night.  She remembers having severe abdominal cramping.  Following this, she felt diaphoretic, flushed, lightheaded, and had a syncopal episode that led to concussion.  When patient awoke, she found herself drenched in sweat.  She was able to regain consciousness and did not have any symptoms thereafter.  Patient has had similar episodes at least 7 times in last 10 years.  She does have severe constipation.  At least 2 of these episodes have occurred in conjunction with constipation and abdominal cramping.  She denies any exertional chest pain, but has noticed dyspnea with her routine physical exercise.  She denies any orthopnea, PND, leg edema.  Her blood pressure is usually normal and she is not on any antihypertensive medications.  Blood pressure is elevated today on 3 separate checks.  Orthostatic vitals are negative.   Past Medical History:  Diagnosis Date  . Menopausal state 11/15/2009   04/19/2010 FSH 119.0 No HRT  . Osteoporosis      Past Surgical History:  Procedure Laterality Date  . BREAST CYST EXCISION Right 1989   benign  . BREAST EXCISIONAL BIOPSY Right   . CERVICAL DISC ARTHROPLASTY  2006   fusison of C6-C7 & C8  DDD of Cervical neck  . CERVICAL DISC ARTHROPLASTY  2009   Refusion of Cervical neck C 6-8  . ENDOMETRIAL BIOPSY  04/28/2011   endo polyp, no hyperplasia, History of PMB  . HYSTEROPLASTY REPAIR OF UTERINE ANOMALY  12/2005 & 12/2006   removal of fibroids causing postmenopausal bleeding  . HYSTEROSCOPY  12/07   Resection of polyp     Social History   Socioeconomic History  . Marital status: Married    Spouse name: Gianah Hudley  . Number of children: 1  . Years of education: Not on file  . Highest education level: Not on file  Occupational History  . Occupation: home maker  Social Needs  . Financial resource strain: Not on file  . Food insecurity    Worry: Not on file    Inability: Not on file  . Transportation needs    Medical: Not on file    Non-medical: Not on file  Tobacco Use  . Smoking status: Never Smoker  . Smokeless tobacco: Never Used  Substance and Sexual Activity  . Alcohol use: Yes    Comment: 1-2 wine per month  . Drug use: No  . Sexual activity: Yes    Partners: Male    Birth control/protection: Post-menopausal  Lifestyle  . Physical activity    Days per week: Not on file    Minutes per session: Not on file  . Stress: Not on file  Relationships  . Social Musician on phone: Not on file    Gets together: Not on file    Attends religious service: Not on file    Active member of club or organization: Not on file    Attends meetings of clubs or organizations: Not on file    Relationship status:  Not on file  . Intimate partner violence    Fear of current or ex partner: Not on file    Emotionally abused: Not on file    Physically abused: Not on file    Forced sexual activity: Not on file  Other Topics Concern  . Not on file  Social History Narrative  . Not on file     Family History  Problem Relation Age of Onset  . Hypertension Father   . Heart failure Father   . Hypertension Mother   . Osteoporosis Mother   . Thyroid disease Mother   . Hypertension Brother        alive - COPD  . Heart attack Brother [phone]       MI and HTN  . Diabetes Sister   . Breast cancer Maternal Grandmother 90  . Breast cancer Other 89     Current Outpatient Medications on File Prior to Visit  Medication Sig Dispense Refill  . alendronate (FOSAMAX) 70 MG  tablet TAKE 1 TABLET BY MOUTH EVERY WEEK WITH 240 MLS OF WATER 30 MINUTES PRIOR TO OTHER FOOD/DRINK OR MEDICINES  3   No current facility-administered medications on file prior to visit.     Cardiovascular studies:  EKG 09/25/2018: Sinus rhythm 59 bpm. Negative precordial T-waves . Consider anteroseptal ischemia.   Recent labs: Not available   Review of Systems  Constitution: Negative for decreased appetite, malaise/fatigue, weight gain and weight loss.  HENT: Negative for congestion.   Eyes: Negative for visual disturbance.  Cardiovascular: Positive for dyspnea on exertion, near-syncope and syncope. Negative for chest pain, leg swelling and palpitations.  Respiratory: Negative for cough.   Endocrine: Negative for cold intolerance.  Hematologic/Lymphatic: Does not bruise/bleed easily.  Skin: Negative for itching and rash.  Musculoskeletal: Negative for myalgias.  Gastrointestinal: Negative for abdominal pain, nausea and vomiting.  Genitourinary: Negative for dysuria.  Neurological: Negative for dizziness and weakness.  Psychiatric/Behavioral: The patient is not nervous/anxious.   All other systems reviewed and are negative.        Vitals:   09/25/18 1049  BP: (!) 147/75  Pulse: 60  Temp: 98 F (36.7 C)  SpO2: (!) 10%     Body mass index is 26.02 kg/m. Filed Weights   09/25/18 1049  Weight: 146 lb 14.4 oz (66.6 kg)     Objective:   Physical Exam  Constitutional: She is oriented to person, place, and time. She appears well-developed and well-nourished. No distress.  HENT:  Head: Normocephalic and atraumatic.  Eyes: Pupils are equal, round, and reactive to light. Conjunctivae are normal.  Neck: No JVD present.  Cardiovascular: Normal rate, regular rhythm and intact distal pulses.  No murmur heard. Pulmonary/Chest: Effort normal and breath sounds normal. She has no wheezes. She has no rales.  Abdominal: Soft. Bowel sounds are normal. There is no rebound.   Musculoskeletal:        General: No edema.  Lymphadenopathy:    She has no cervical adenopathy.  Neurological: She is alert and oriented to person, place, and time. No cranial nerve deficit.  Skin: Skin is warm and dry.  Psychiatric: She has a normal mood and affect.  Nursing note and vitals reviewed.       Assessment & Recommendations:   58 y.o. Caucasian female with recurrent episodes of presyncope/syncope, exertional dyspnea.  Syncope: By history, appears to be neurocardiogenic or vasovagal syncope, possibly triggered by GI symptoms.  Will obtain echocardiogram.  Since her symptoms are infrequent,  I do not think Holter monitor will be high yield test.  Given her exertional dyspnea, will obtain exercise treadmill stress test.  Recommend liberal hydration, counterpressure maneuvers, use of compression stockings.  Elevated blood pressure without diagnosis of hypertension: Baseline blood pressures are normal.  Suspect whitecoat syndrome.  Recommend home monitoring.  Thank you for referring the patient to [place]. Please feel free to contact with any questions.  Nigel Mormon, MD [facility] Pager: [phone] Office: [phone] If no answer Cell [phone]

## 2018-09-25 ENCOUNTER — Ambulatory Visit (INDEPENDENT_AMBULATORY_CARE_PROVIDER_SITE_OTHER): Payer: BC Managed Care – PPO | Admitting: Cardiology

## 2018-09-25 ENCOUNTER — Encounter: Payer: Self-pay | Admitting: Cardiology

## 2018-09-25 ENCOUNTER — Other Ambulatory Visit: Payer: Self-pay

## 2018-09-25 VITALS — BP 147/75 | HR 60 | Temp 98.0°F | Ht 63.0 in | Wt 146.9 lb

## 2018-09-25 DIAGNOSIS — R55 Syncope and collapse: Secondary | ICD-10-CM

## 2018-09-25 DIAGNOSIS — R0600 Dyspnea, unspecified: Secondary | ICD-10-CM

## 2018-09-25 DIAGNOSIS — R030 Elevated blood-pressure reading, without diagnosis of hypertension: Secondary | ICD-10-CM | POA: Diagnosis not present

## 2018-09-25 DIAGNOSIS — R0609 Other forms of dyspnea: Secondary | ICD-10-CM | POA: Insufficient documentation

## 2018-09-26 DIAGNOSIS — L814 Other melanin hyperpigmentation: Secondary | ICD-10-CM | POA: Diagnosis not present

## 2018-09-26 DIAGNOSIS — D225 Melanocytic nevi of trunk: Secondary | ICD-10-CM | POA: Diagnosis not present

## 2018-09-26 DIAGNOSIS — L821 Other seborrheic keratosis: Secondary | ICD-10-CM | POA: Diagnosis not present

## 2018-09-26 DIAGNOSIS — L570 Actinic keratosis: Secondary | ICD-10-CM | POA: Diagnosis not present

## 2018-09-29 DIAGNOSIS — Z23 Encounter for immunization: Secondary | ICD-10-CM | POA: Diagnosis not present

## 2018-10-04 ENCOUNTER — Other Ambulatory Visit: Payer: BC Managed Care – PPO

## 2018-10-05 ENCOUNTER — Other Ambulatory Visit: Payer: Self-pay

## 2018-10-09 ENCOUNTER — Ambulatory Visit (INDEPENDENT_AMBULATORY_CARE_PROVIDER_SITE_OTHER): Payer: BC Managed Care – PPO | Admitting: Certified Nurse Midwife

## 2018-10-09 ENCOUNTER — Encounter: Payer: Self-pay | Admitting: Certified Nurse Midwife

## 2018-10-09 ENCOUNTER — Other Ambulatory Visit: Payer: Self-pay

## 2018-10-09 VITALS — BP 106/64 | HR 68 | Temp 97.2°F | Resp 16 | Ht 63.75 in | Wt 144.0 lb

## 2018-10-09 DIAGNOSIS — Z01419 Encounter for gynecological examination (general) (routine) without abnormal findings: Secondary | ICD-10-CM

## 2018-10-09 NOTE — Progress Notes (Signed)
58 y.o. G8P1001 Married  Caucasian Fe here for annual exam. Menopausal no HRT. Denies vaginal dryness or bleeding. Noted bleeding one week ago with UTI only. Feels she had passes a stone. Does not feel vaginal related. She had fever with this UTI and was put on medication. Feeling much better now. Aware of update of DTAP, plans with [facility] and has cardiac evaluation scheduled due to fainting episode. No other health issues today.  Patient's last menstrual period was 11/15/2009.          Sexually active: Yes.    The current method of family planning is post menopausal status.    Exercising: Yes.    walking, weight room Smoker:  no  Review of Systems  Constitutional: Negative.   HENT: Negative.   Eyes: Negative.   Respiratory: Negative.   Cardiovascular: Negative.   Gastrointestinal: Negative.   Genitourinary: Negative.   Musculoskeletal: Negative.   Skin: Negative.   Neurological: Negative.   Endo/Heme/Allergies: Negative.   Psychiatric/Behavioral: Negative.     Health Maintenance: Pap:  07-12-17 neg HPV HR neg History of Abnormal Pap: no MMG:  08-23-2018 category c density birads 1:neg Self Breast exams: no Colonoscopy:  6/11 hyperplastic polyp f/u 78yrs BMD:   2018 osteoporosis, Dr Virgina Jock manages, has scheduled TDaP:  2010 Shingles: no Pneumonia: no Hep C and HIV: both neg 2017 Labs: with PCP   reports that she has never smoked. She has never used smokeless tobacco. She reports current alcohol use. She reports that she does not use drugs.  Past Medical History:  Diagnosis Date  . Menopausal state 11/15/2009   04/19/2010 FSH 119.0 No HRT  . Osteoporosis     Past Surgical History:  Procedure Laterality Date  . BREAST CYST EXCISION Right 1989   benign  . BREAST EXCISIONAL BIOPSY Right   . CERVICAL DISC ARTHROPLASTY  2006   fusison of C6-C7 & C8  DDD of Cervical neck  . CERVICAL DISC ARTHROPLASTY  2009   Refusion of Cervical neck C 6-8  . ENDOMETRIAL BIOPSY  04/28/2011    endo polyp, no hyperplasia, History of PMB  . HYSTEROPLASTY REPAIR OF UTERINE ANOMALY  12/2005 & 12/2006   removal of fibroids causing postmenopausal bleeding  . HYSTEROSCOPY  12/07   Resection of polyp    Current Outpatient Medications  Medication Sig Dispense Refill  . alendronate (FOSAMAX) 70 MG tablet TAKE 1 TABLET BY MOUTH EVERY WEEK WITH 240 MLS OF WATER 30 MINUTES PRIOR TO OTHER FOOD/DRINK OR MEDICINES  3  . docusate sodium (COLACE) 100 MG capsule Take 100 mg by mouth as needed for mild constipation.    . Polyethylene Glycol 3350 (MIRALAX PO) Take by mouth as needed.    . sennosides-docusate sodium (SENOKOT-S) 8.6-50 MG tablet Take 1 tablet by mouth as needed for constipation.     No current facility-administered medications for this visit.     Family History  Problem Relation Age of Onset  . Hypertension Father   . Heart failure Father   . Hypertension Mother   . Osteoporosis Mother   . Thyroid disease Mother   . Hypertension Brother        alive - COPD  . Heart attack Brother 41       MI and HTN  . Diabetes Sister   . Breast cancer Maternal Grandmother 90  . Breast cancer Other 89    ROS:  Pertinent items are noted in HPI.  Otherwise, a comprehensive ROS was negative.  Exam:   BP 106/64   Pulse 68   Temp (!) 97.2 F (36.2 C) (Skin)   Resp 16   Ht 5' 3.75" (1.619 m)   Wt 144 lb (65.3 kg)   LMP 11/15/2009   BMI 24.91 kg/m  Height: 5' 3.75" (161.9 cm) Ht Readings from Last 3 Encounters:  10/09/18 5' 3.75" (1.619 m)  09/25/18 5\' 3"  (1.6 m)  07/12/17 5' 3.25" (1.607 m)    General appearance: alert, cooperative and appears stated age Head: Normocephalic, without obvious abnormality, atraumatic Neck: no adenopathy, supple, symmetrical, trachea midline and thyroid normal to inspection and palpation Lungs: clear to auscultation bilaterally Breasts: normal appearance, no masses or tenderness, No nipple retraction or dimpling, No nipple discharge or bleeding,  No axillary or supraclavicular adenopathy Heart: regular rate and rhythm Abdomen: soft, non-tender; no masses,  no organomegaly Extremities: extremities normal, atraumatic, no cyanosis or edema Skin: Skin color, texture, turgor normal. No rashes or lesions Lymph nodes: Cervical, supraclavicular, and axillary nodes normal. No abnormal inguinal nodes palpated Neurologic: Grossly normal   Pelvic: External genitalia:  no lesions              Urethra:  normal appearing urethra with no masses, tenderness or lesions              Bartholin's and Skene's: normal                 Vagina: normal appearing vagina with normal color and discharge, no lesions              Cervix: no cervical motion tenderness, no lesions and no abnormal discharge noted from cervix or in vagina              Pap taken: No. Bimanual Exam:  Uterus:  normal size, contour, position, consistency, mobility, non-tender and anteverted              Adnexa: normal adnexa and no mass, fullness, tenderness               Rectovaginal: Confirms               Anus:  normal sphincter tone, no lesions  Chaperone present: yes  A:  Well Woman with normal exam  Menopausal no HRT  Recent UTI with hematuria, with stone passage, treated no issues today  Episode of syncope with injury, has cardiac evaluation scheduled.  Osteoporosis with PCP management on Fosamax  Immunization due declines will do with PCP  P:   Reviewed health and wellness pertinent to exam  Aware of need to advise if vaginal bleeding  Stressed importance of sitting down if any symptoms this may occur.  Work on regular exercise and stretching.  Pap smear: no  counseled on breast self exam, mammography screening, menopause, adequate intake of calcium and vitamin D, diet and exercise, Kegel's exercises return annually or prn  An After Visit Summary was printed and given to the patient.

## 2018-10-09 NOTE — Patient Instructions (Signed)

## 2018-10-10 ENCOUNTER — Ambulatory Visit (INDEPENDENT_AMBULATORY_CARE_PROVIDER_SITE_OTHER): Payer: BC Managed Care – PPO

## 2018-10-10 DIAGNOSIS — R55 Syncope and collapse: Secondary | ICD-10-CM | POA: Diagnosis not present

## 2018-10-10 DIAGNOSIS — R0609 Other forms of dyspnea: Secondary | ICD-10-CM

## 2018-10-10 DIAGNOSIS — R0600 Dyspnea, unspecified: Secondary | ICD-10-CM

## 2018-10-11 DIAGNOSIS — H5213 Myopia, bilateral: Secondary | ICD-10-CM | POA: Diagnosis not present

## 2018-10-11 DIAGNOSIS — H10413 Chronic giant papillary conjunctivitis, bilateral: Secondary | ICD-10-CM | POA: Diagnosis not present

## 2018-10-18 ENCOUNTER — Ambulatory Visit (INDEPENDENT_AMBULATORY_CARE_PROVIDER_SITE_OTHER): Payer: BC Managed Care – PPO

## 2018-10-18 ENCOUNTER — Telehealth: Payer: BC Managed Care – PPO | Admitting: Cardiology

## 2018-10-18 ENCOUNTER — Other Ambulatory Visit: Payer: Self-pay

## 2018-10-18 DIAGNOSIS — R55 Syncope and collapse: Secondary | ICD-10-CM | POA: Diagnosis not present

## 2018-10-22 NOTE — Progress Notes (Signed)
Patient referred by [name], MD for syncope  Subjective:   [name], female    DOB: September 19, 1960, 58 y.o.   MRN: [id number]  I connected with the patient on 10/25/2018 by a video enabled telemedicine application and verified that I am speaking with the correct person using two identifiers.     I discussed the limitations of evaluation and management by telemedicine and the availability of in person appointments. The patient expressed understanding and agreed to proceed.   This visit type was conducted due to national recommendations for restrictions regarding the COVID-19 Pandemic (e.g. social distancing).  This format is felt to be most appropriate for this patient at this time.  All issues noted in this document were discussed and addressed.  No physical exam was performed (except for noted visual exam findings with Tele health visits).  The patient has consented to conduct a Tele health visit and understands insurance will be billed.   Chief Complaint  Patient presents with  . Loss of Consciousness     HPI  58 y.o. Caucasian female with recurrent episodes of presyncope/syncope, exertional dyspnea.  Patient's most recent episode of syncope occur few weeks ago.  Patient got up to go to the bathroom middle of night.  She remembers having severe abdominal cramping.  Following this, she felt diaphoretic, flushed, lightheaded, and had a syncopal episode that led to concussion.  When patient awoke, she found herself drenched in sweat.  She was able to regain consciousness and did not have any symptoms thereafter.  Patient has had similar episodes at least 7 times in last 10 years.  She does have severe constipation.  At least 2 of these episodes have occurred in conjunction with constipation and abdominal cramping.  She denies any exertional chest pain, but has noticed dyspnea with her routine physical exercise.  She denies any orthopnea, PND, leg edema.  Her blood pressure is usually  normal and she is not on any antihypertensive medications.    Exercise treadmill stress test showed excellent exercise capacity and no ischemic change.s Echocardiogram showed normal mild MR, otherwise normal.  Blood pressure has improved after reducing caffeine intake, now normal.   Past Medical History:  Diagnosis Date  . Menopausal state 11/15/2009   04/19/2010 FSH 119.0 No HRT  . Osteoporosis      Past Surgical History:  Procedure Laterality Date  . BREAST CYST EXCISION Right 1989   benign  . BREAST EXCISIONAL BIOPSY Right   . CERVICAL DISC ARTHROPLASTY  2006   fusison of C6-C7 & C8  DDD of Cervical neck  . CERVICAL DISC ARTHROPLASTY  2009   Refusion of Cervical neck C 6-8  . ENDOMETRIAL BIOPSY  04/28/2011   endo polyp, no hyperplasia, History of PMB  . HYSTEROPLASTY REPAIR OF UTERINE ANOMALY  12/2005 & 12/2006   removal of fibroids causing postmenopausal bleeding  . HYSTEROSCOPY  12/07   Resection of polyp     Social History   Socioeconomic History  . Marital status: Married    Spouse name: Lorijean Husser  . Number of children: 1  . Years of education: Not on file  . Highest education level: Not on file  Occupational History  . Occupation: home maker  Social Needs  . Financial resource strain: Not on file  . Food insecurity    Worry: Not on file    Inability: Not on file  . Transportation needs    Medical: Not on file    Non-medical:  Not on file  Tobacco Use  . Smoking status: Never Smoker  . Smokeless tobacco: Never Used  Substance and Sexual Activity  . Alcohol use: Yes    Comment: 1-2 wine per month  . Drug use: No  . Sexual activity: Yes    Partners: Male    Birth control/protection: Post-menopausal  Lifestyle  . Physical activity    Days per week: Not on file    Minutes per session: Not on file  . Stress: Not on file  Relationships  . Social Musicianconnections    Talks on phone: Not on file    Gets together: Not on file    Attends religious service:  Not on file    Active member of club or organization: Not on file    Attends meetings of clubs or organizations: Not on file    Relationship status: Not on file  . Intimate partner violence    Fear of current or ex partner: Not on file    Emotionally abused: Not on file    Physically abused: Not on file    Forced sexual activity: Not on file  Other Topics Concern  . Not on file  Social History Narrative  . Not on file     Family History  Problem Relation Age of Onset  . Hypertension Father   . Heart failure Father   . Hypertension Mother   . Osteoporosis Mother   . Thyroid disease Mother   . Hypertension Brother        alive - COPD  . Heart attack Brother [id number]       MI and HTN  . Diabetes Sister   . Breast cancer Maternal Grandmother 90  . Breast cancer Other 89     Current Outpatient Medications on File Prior to Visit  Medication Sig Dispense Refill  . alendronate (FOSAMAX) 70 MG tablet TAKE 1 TABLET BY MOUTH EVERY WEEK WITH 240 MLS OF WATER 30 MINUTES PRIOR TO OTHER FOOD/DRINK OR MEDICINES  3  . docusate sodium (COLACE) 100 MG capsule Take 100 mg by mouth as needed for mild constipation.    . Polyethylene Glycol 3350 (MIRALAX PO) Take by mouth as needed.    . sennosides-docusate sodium (SENOKOT-S) 8.6-50 MG tablet Take 1 tablet by mouth as needed for constipation.     No current facility-administered medications on file prior to visit.     Cardiovascular studies:  Exercise treadmill stress test 10/18/2018: Exercise treadmill stress test performed using Bruce protocol.  Patient reached 12.1 METS, and 104% of age predicted maximum heart rate.   Exercise capacity was excellent. No chest pain reported.   Normal heart rate and hemodynamic response.  Stress EKG revealed no ischemic changes. Normal stress test.  Echocardiogram 10/10/2018: Left ventricle cavity is normal in size. Normal left ventricular wall thickness. Normal LV systolic function with EF 60%. Normal global  wall motion. Normal diastolic filling pattern. Calculated EF 60%. Mild (Grade I) mitral regurgitation. Inadequate TR jet to estimate pulmonary artery systolic pressure. Normal right atrial pressure.    EKG 09/25/2018: Sinus rhythm 59 bpm. Negative precordial T-waves . Consider anteroseptal ischemia.   Recent labs: Not available   Review of Systems  Constitution: Negative for decreased appetite, malaise/fatigue, weight gain and weight loss.  HENT: Negative for congestion.   Eyes: Negative for visual disturbance.  Cardiovascular: Positive for dyspnea on exertion, near-syncope and syncope. Negative for chest pain, leg swelling and palpitations.  Respiratory: Negative for cough.   Endocrine: Negative for  cold intolerance.  Hematologic/Lymphatic: Does not bruise/bleed easily.  Skin: Negative for itching and rash.  Musculoskeletal: Negative for myalgias.  Gastrointestinal: Negative for abdominal pain, nausea and vomiting.  Genitourinary: Negative for dysuria.  Neurological: Negative for dizziness and weakness.  Psychiatric/Behavioral: The patient is not nervous/anxious.   All other systems reviewed and are negative.        Vitals:   10/25/18 1452  BP: 110/72  Pulse: 60     Objective:   Physical Exam  Constitutional: She is oriented to person, place, and time. She appears well-developed and well-nourished. No distress.  Pulmonary/Chest: Effort normal.  Neurological: She is alert and oriented to person, place, and time.  Psychiatric: She has a normal mood and affect.  Nursing note and vitals reviewed.       Assessment & Recommendations:   58 y.o. Caucasian female with recurrent episodes of presyncope/syncope, exertional dyspnea.  Syncope: By history, appears to be neurocardiogenic or vasovagal syncope, possibly triggered by GI symptoms. Structurally normal heart on echocardiogram with normal exercsie treadmill stress test. Since her symptoms are infrequent, I do not  think Holter monitor will be high yield test. Recommend liberal hydration, counterpressure maneuvers, use of compression stockings.  Elevated blood pressure without diagnosis of hypertension: Now improved after reducing caffeine intake.   As needed follow up.  Nigel Mormon, MD [facility] Pager: [phone] Office: [phone] If no answer Cell [phone]

## 2018-10-23 DIAGNOSIS — M810 Age-related osteoporosis without current pathological fracture: Secondary | ICD-10-CM | POA: Diagnosis not present

## 2018-10-25 ENCOUNTER — Other Ambulatory Visit: Payer: Self-pay

## 2018-10-25 ENCOUNTER — Telehealth: Payer: BC Managed Care – PPO | Admitting: Cardiology

## 2018-10-25 VITALS — BP 110/72 | HR 60

## 2018-10-25 DIAGNOSIS — R55 Syncope and collapse: Secondary | ICD-10-CM

## 2018-10-25 DIAGNOSIS — R030 Elevated blood-pressure reading, without diagnosis of hypertension: Secondary | ICD-10-CM | POA: Diagnosis not present

## 2018-12-12 DIAGNOSIS — R7989 Other specified abnormal findings of blood chemistry: Secondary | ICD-10-CM | POA: Diagnosis not present

## 2018-12-12 DIAGNOSIS — Z0000 Encounter for general adult medical examination without abnormal findings: Secondary | ICD-10-CM | POA: Diagnosis not present

## 2018-12-12 DIAGNOSIS — Z130 Encounter for screening for diseases of the blood and blood-forming organs and certain disorders involving the immune mechanism: Secondary | ICD-10-CM | POA: Insufficient documentation

## 2018-12-12 DIAGNOSIS — K5900 Constipation, unspecified: Secondary | ICD-10-CM | POA: Diagnosis not present

## 2018-12-12 DIAGNOSIS — M810 Age-related osteoporosis without current pathological fracture: Secondary | ICD-10-CM | POA: Diagnosis not present

## 2018-12-18 DIAGNOSIS — R82998 Other abnormal findings in urine: Secondary | ICD-10-CM | POA: Diagnosis not present

## 2018-12-18 DIAGNOSIS — I10 Essential (primary) hypertension: Secondary | ICD-10-CM | POA: Diagnosis not present

## 2018-12-19 DIAGNOSIS — N200 Calculus of kidney: Secondary | ICD-10-CM | POA: Insufficient documentation

## 2018-12-19 DIAGNOSIS — R030 Elevated blood-pressure reading, without diagnosis of hypertension: Secondary | ICD-10-CM | POA: Diagnosis not present

## 2018-12-19 DIAGNOSIS — Z1331 Encounter for screening for depression: Secondary | ICD-10-CM | POA: Diagnosis not present

## 2018-12-19 DIAGNOSIS — M5030 Other cervical disc degeneration, unspecified cervical region: Secondary | ICD-10-CM | POA: Diagnosis not present

## 2018-12-19 DIAGNOSIS — Z0000 Encounter for general adult medical examination without abnormal findings: Secondary | ICD-10-CM | POA: Diagnosis not present

## 2018-12-19 DIAGNOSIS — M810 Age-related osteoporosis without current pathological fracture: Secondary | ICD-10-CM | POA: Diagnosis not present

## 2018-12-26 DIAGNOSIS — Z1212 Encounter for screening for malignant neoplasm of rectum: Secondary | ICD-10-CM | POA: Diagnosis not present

## 2019-04-02 ENCOUNTER — Encounter: Payer: Self-pay | Admitting: Certified Nurse Midwife

## 2019-04-06 DIAGNOSIS — M84375A Stress fracture, left foot, initial encounter for fracture: Secondary | ICD-10-CM | POA: Diagnosis not present

## 2019-07-11 ENCOUNTER — Other Ambulatory Visit: Payer: Self-pay | Admitting: Internal Medicine

## 2019-07-11 DIAGNOSIS — Z1231 Encounter for screening mammogram for malignant neoplasm of breast: Secondary | ICD-10-CM

## 2019-08-27 ENCOUNTER — Ambulatory Visit: Payer: BC Managed Care – PPO

## 2019-09-03 ENCOUNTER — Other Ambulatory Visit: Payer: Self-pay

## 2019-09-03 ENCOUNTER — Ambulatory Visit
Admission: RE | Admit: 2019-09-03 | Discharge: 2019-09-03 | Disposition: A | Discharge status: Home / Self Care | Payer: BC Managed Care – PPO | Source: Ambulatory Visit | Attending: Internal Medicine | Admitting: Internal Medicine

## 2019-09-03 DIAGNOSIS — Z1231 Encounter for screening mammogram for malignant neoplasm of breast: Secondary | ICD-10-CM

## 2019-10-10 ENCOUNTER — Ambulatory Visit: Payer: BC Managed Care – PPO | Admitting: Certified Nurse Midwife

## 2019-10-17 DIAGNOSIS — Z23 Encounter for immunization: Secondary | ICD-10-CM | POA: Diagnosis not present

## 2019-10-24 ENCOUNTER — Ambulatory Visit (INDEPENDENT_AMBULATORY_CARE_PROVIDER_SITE_OTHER): Payer: BC Managed Care – PPO | Admitting: Obstetrics and Gynecology

## 2019-10-24 ENCOUNTER — Other Ambulatory Visit: Payer: Self-pay

## 2019-10-24 ENCOUNTER — Encounter: Payer: Self-pay | Admitting: Obstetrics and Gynecology

## 2019-10-24 VITALS — BP 128/74 | HR 66 | Ht 63.5 in | Wt 144.0 lb

## 2019-10-24 DIAGNOSIS — Z8739 Personal history of other diseases of the musculoskeletal system and connective tissue: Secondary | ICD-10-CM | POA: Diagnosis not present

## 2019-10-24 DIAGNOSIS — Z01419 Encounter for gynecological examination (general) (routine) without abnormal findings: Secondary | ICD-10-CM

## 2019-10-24 NOTE — Progress Notes (Signed)
59 y.o. G72P1001 Married White or Caucasian Not Hispanic or Latino female here for annual exam.  No vaginal bleeding, no dyspareunia.  Intermittent constipation.     Patient's last menstrual period was 11/15/2009.          Sexually active: Yes.    The current method of family planning is post menopausal status.    Exercising: Yes.    walking and gym and Golf.  Smoker:  no  Health Maintenance: Pap:  07/12/17 neg HR HPV Neg, 06/04/14 WNL Hr HPV Neg.  History of abnormal Pap:  no MMG:  09/04/19 density C Bi-rads 1 neg  BMD: 2018 osteoporosis, Dr Timothy Lasso manages   Colonoscopy:6/11 hyperplastic polyp f/u 55yrs, she has gotten a reminder. She had issues with the anesthesia, felt she got too much, messed up her sleep schedule for year.   TDaP:  2010, thinks UTD with primary Gardasil: NA   reports that she has never smoked. She has never used smokeless tobacco. She reports current alcohol use. She reports that she does not use drugs. Her daughter is having a baby girl next month. Lives in [place]. Her husband is high up in a furniture company. 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   Past Medical History:  Diagnosis Date  . Menopausal state 11/15/2009   04/19/2010 FSH 119.0 No HRT  . Osteoporosis     Past Surgical History:  Procedure Laterality Date  . BREAST CYST EXCISION Right 1989   benign  . BREAST EXCISIONAL BIOPSY Right   . CERVICAL DISC ARTHROPLASTY  2006   fusison of C6-C7 & C8  DDD of Cervical neck  . CERVICAL DISC ARTHROPLASTY  2009   Refusion of Cervical neck C 6-8  . ENDOMETRIAL BIOPSY  04/28/2011   endo polyp, no hyperplasia, History of PMB  . HYSTEROPLASTY REPAIR OF UTERINE ANOMALY  12/2005 & 12/2006   removal of fibroids causing postmenopausal bleeding  . HYSTEROSCOPY  12/07   Resection of polyp    Current Outpatient Medications  Medication Sig Dispense Refill  . alendronate (FOSAMAX) 70 MG tablet TAKE 1 TABLET BY MOUTH EVERY WEEK WITH 240 MLS OF WATER 30 MINUTES  PRIOR TO OTHER FOOD/DRINK OR MEDICINES  3  . docusate sodium (COLACE) 100 MG capsule Take 100 mg by mouth as needed for mild constipation.    . Polyethylene Glycol 3350 (MIRALAX PO) Take by mouth as needed.    . sennosides-docusate sodium (SENOKOT-S) 8.6-50 MG tablet Take 1 tablet by mouth as needed for constipation.     No current facility-administered medications for this visit.    Family History  Problem Relation Age of Onset  . Hypertension Father   . Heart failure Father   . Hypertension Mother   . Osteoporosis Mother   . Thyroid disease Mother   . Hypertension Brother        alive - COPD  . Heart attack Brother 16       MI and HTN  . Diabetes Sister   . Breast cancer Maternal Grandmother 90  . Breast cancer Other 89    Review of Systems  All other systems reviewed and are negative.   Exam:   BP 128/74   Pulse 66   Ht 5' 3.5" (1.613 m)   Wt 144 lb (65.3 kg)   LMP 11/15/2009   SpO2 100%   BMI 25.11 kg/m   Weight change: @WEIGHTCHANGE @ Height:   Height: 5' 3.5" (161.3 cm)  Ht Readings from Last 3 Encounters:  10/24/19 5' 3.5" (1.613 m)  10/09/18 5' 3.75" (1.619 m)  09/25/18 5\' 3"  (1.6 m)    General appearance: alert, cooperative and appears stated age Head: Normocephalic, without obvious abnormality, atraumatic Neck: no adenopathy, supple, symmetrical, trachea midline and thyroid normal to inspection and palpation Lungs: clear to auscultation bilaterally Cardiovascular: regular rate and rhythm Breasts: normal appearance, no masses or tenderness Abdomen: soft, non-tender; non distended,  no masses,  no organomegaly Extremities: extremities normal, atraumatic, no cyanosis or edema Skin: Skin color, texture, turgor normal. No rashes or lesions Lymph nodes: Cervical, supraclavicular, and axillary nodes normal. No abnormal inguinal nodes palpated Neurologic: Grossly normal   Pelvic: External genitalia:  no lesions              Urethra:  normal appearing urethra  with no masses, tenderness or lesions              Bartholins and Skenes: normal                 Vagina: normal appearing vagina with normal color and discharge, no lesions              Cervix: no lesions               Bimanual Exam:  Uterus:  normal size, contour, position, consistency, mobility, non-tender              Adnexa: no mass, fullness, tenderness               Rectovaginal: Confirms               Anus:  normal sphincter tone, no lesions  chaperoned for the exam.  A:  Well Woman with normal exam  Osteoporosis   P:   No pap this year  Colonoscopy due, she will call GI  DEXA with primary  Mammogram UTD  Discussed breast self exam  Discussed calcium and vit D intake

## 2019-10-24 NOTE — Patient Instructions (Signed)

## 2019-11-06 DIAGNOSIS — H04123 Dry eye syndrome of bilateral lacrimal glands: Secondary | ICD-10-CM | POA: Diagnosis not present

## 2019-11-27 DIAGNOSIS — H5213 Myopia, bilateral: Secondary | ICD-10-CM | POA: Diagnosis not present

## 2019-11-27 DIAGNOSIS — H04123 Dry eye syndrome of bilateral lacrimal glands: Secondary | ICD-10-CM | POA: Diagnosis not present

## 2019-12-27 DIAGNOSIS — H43812 Vitreous degeneration, left eye: Secondary | ICD-10-CM | POA: Diagnosis not present

## 2020-01-17 DIAGNOSIS — H43813 Vitreous degeneration, bilateral: Secondary | ICD-10-CM | POA: Diagnosis not present

## 2020-08-06 ENCOUNTER — Other Ambulatory Visit: Payer: Self-pay | Admitting: Internal Medicine

## 2020-08-06 DIAGNOSIS — Z1231 Encounter for screening mammogram for malignant neoplasm of breast: Secondary | ICD-10-CM

## 2020-08-19 DIAGNOSIS — J22 Unspecified acute lower respiratory infection: Secondary | ICD-10-CM | POA: Diagnosis not present

## 2020-08-19 DIAGNOSIS — R509 Fever, unspecified: Secondary | ICD-10-CM | POA: Diagnosis not present

## 2020-08-19 DIAGNOSIS — R059 Cough, unspecified: Secondary | ICD-10-CM | POA: Diagnosis not present

## 2020-09-03 DIAGNOSIS — J0100 Acute maxillary sinusitis, unspecified: Secondary | ICD-10-CM | POA: Diagnosis not present

## 2020-09-15 IMAGING — MG DIGITAL SCREENING BILATERAL MAMMOGRAM WITH TOMO AND CAD
8 series · 9 of 24 positions shown · non-contrast
Comparison: Previous exam(s).

CLINICAL DATA: Screening.

EXAM:
DIGITAL SCREENING BILATERAL MAMMOGRAM WITH TOMO AND CAD

[R CC synth-2D]
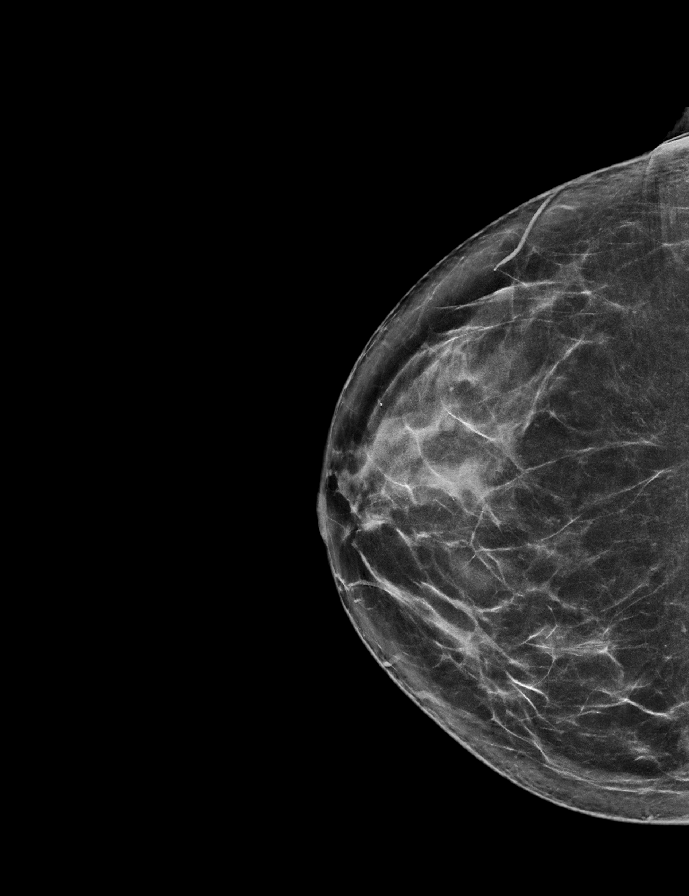

[R MLO synth-2D]
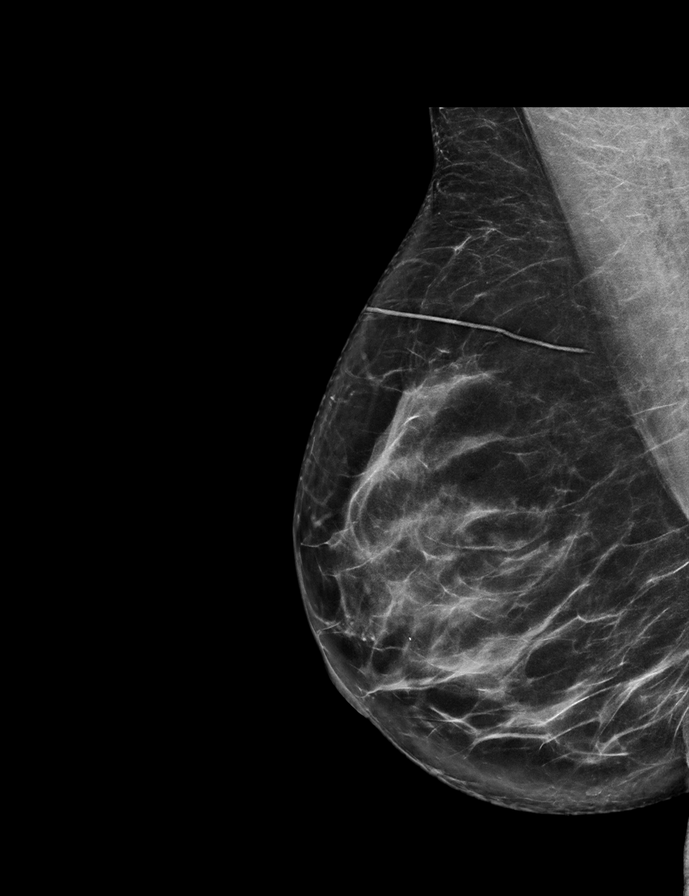

[L CC synth-2D]
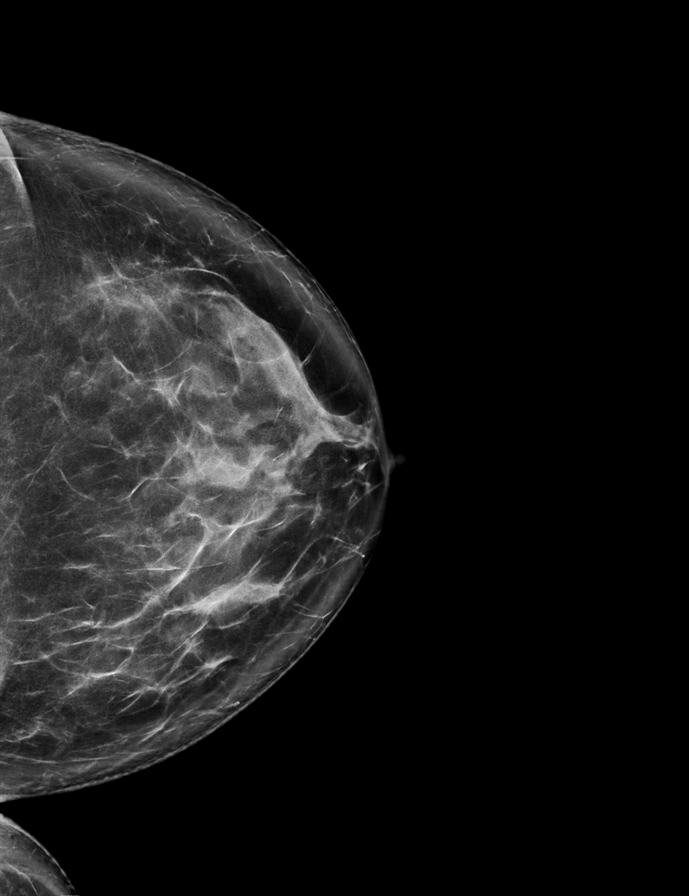

[L MLO synth-2D]
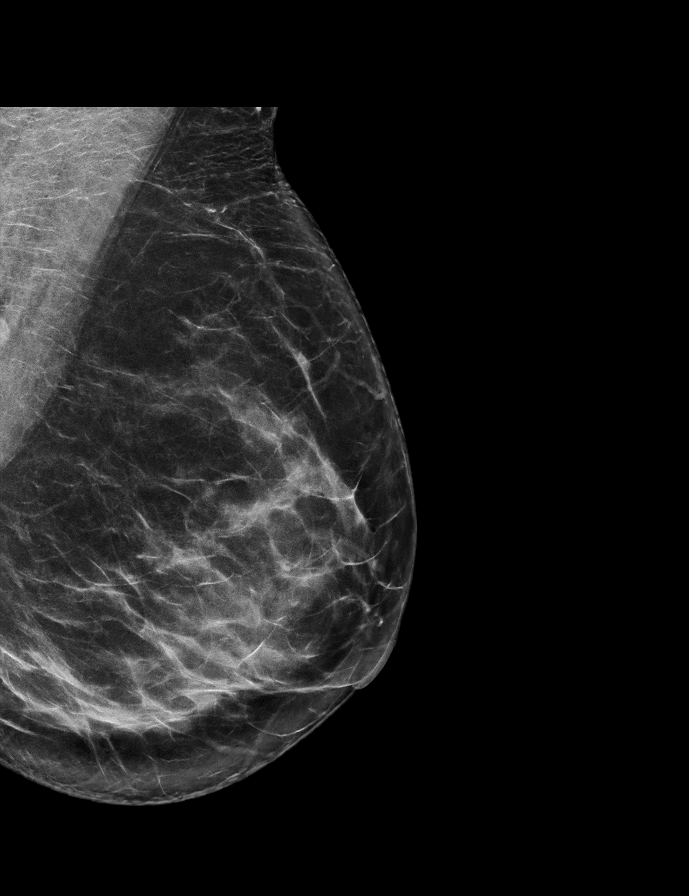

[R CC tomo · 2 of 71 frames shown]
[frame 23/71]
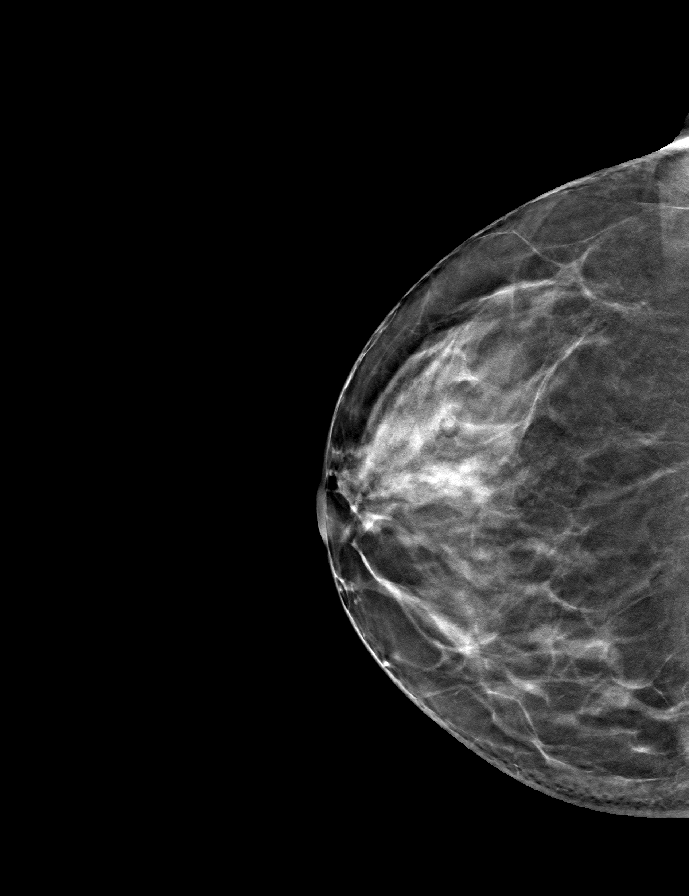
[frame 36/71]
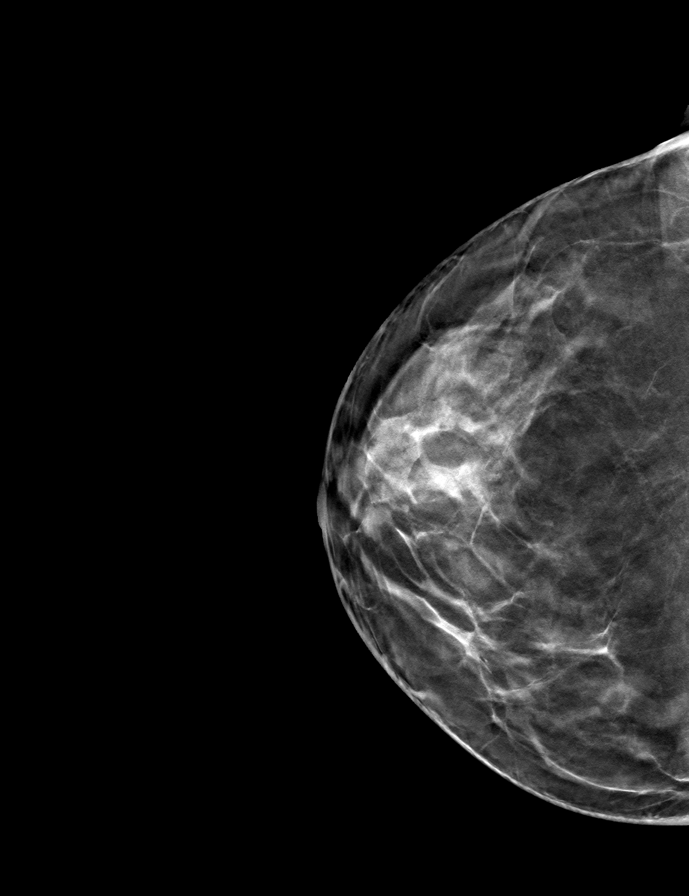

[L MLO tomo · tomo slice 38/75.0]
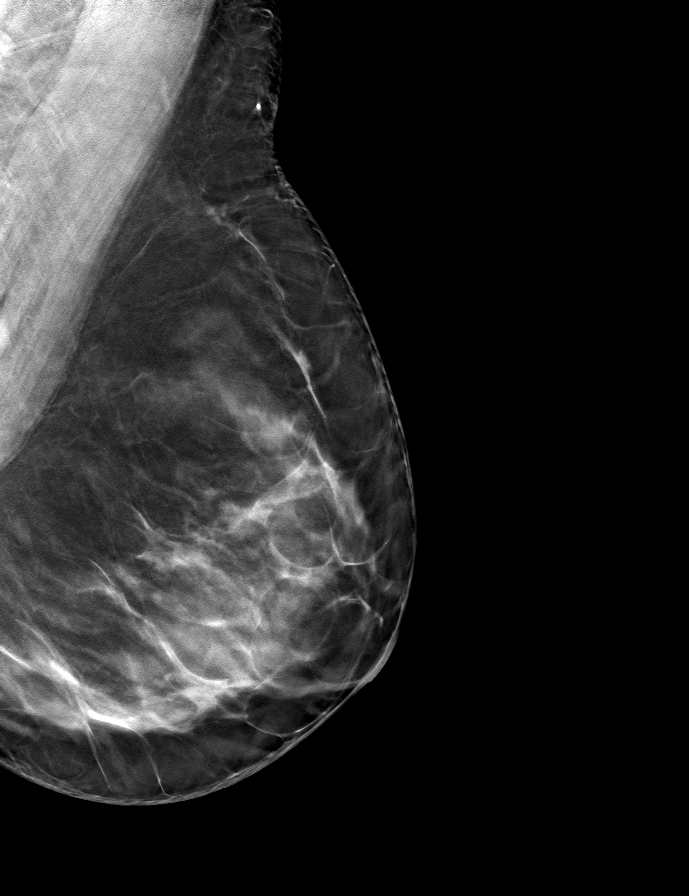

[L CC tomo · tomo slice 39/77.0]
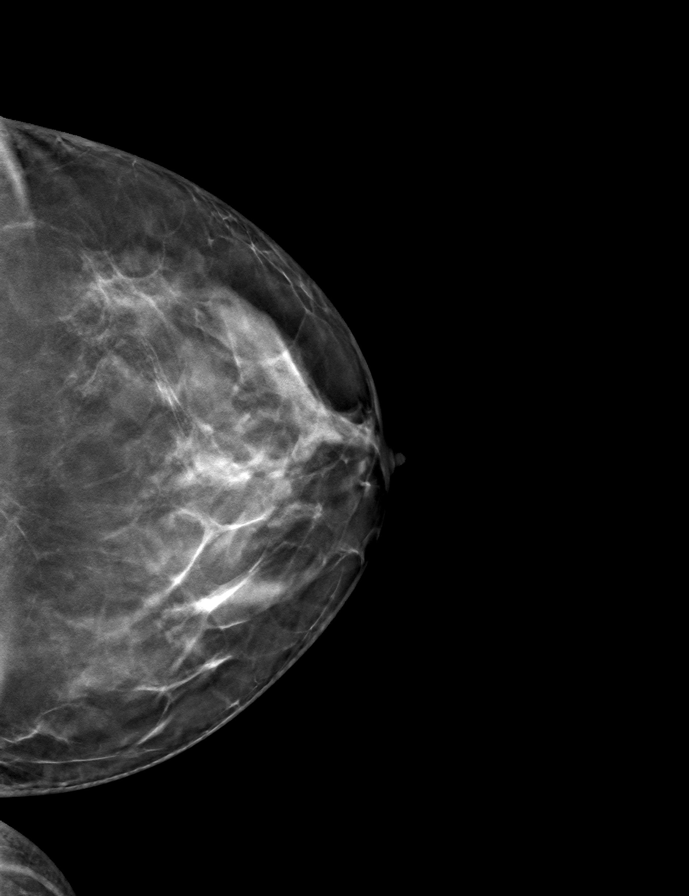

[R MLO tomo · tomo slice 37/72.0]
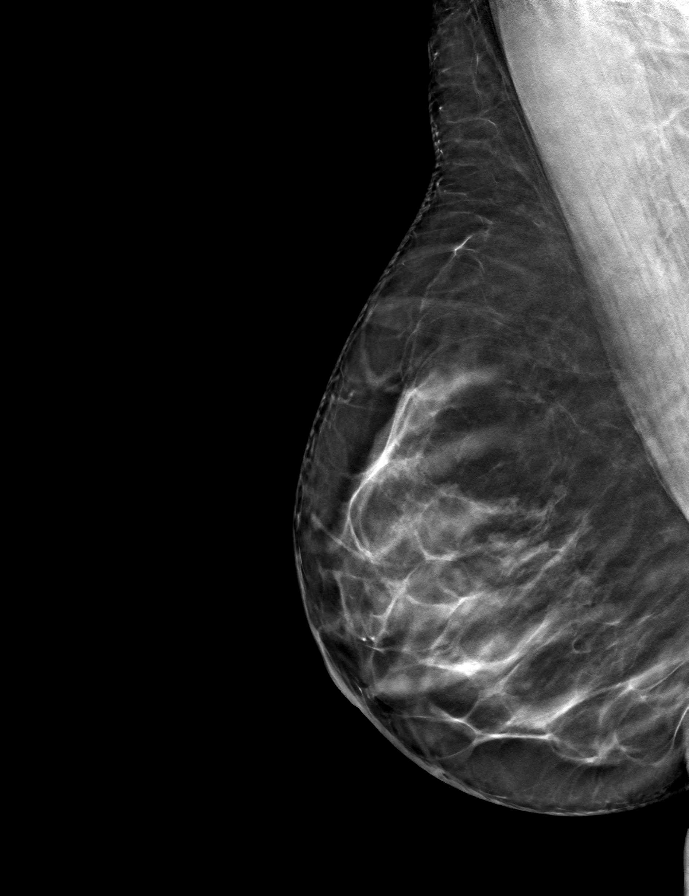

[9 of 24 positions shown; findings below may reference images not displayed]

ACR Breast Density Category c: The breast tissue is heterogeneously
dense, which may obscure small masses.
FINDINGS: There are no findings suspicious for malignancy. Images were
processed with CAD.
IMPRESSION: No mammographic evidence of malignancy. A result letter of this
screening mammogram will be mailed directly to the patient.

RECOMMENDATION:
Screening mammogram in one year. (Code:FT-U-LHB)

BI-RADS CATEGORY  1: Negative.

## 2020-10-15 ENCOUNTER — Ambulatory Visit
Admission: RE | Admit: 2020-10-15 | Discharge: 2020-10-15 | Disposition: A | Discharge status: Home / Self Care | Payer: BLUE CROSS/BLUE SHIELD | Source: Ambulatory Visit | Attending: Internal Medicine | Admitting: Internal Medicine

## 2020-10-15 ENCOUNTER — Other Ambulatory Visit: Payer: Self-pay

## 2020-10-15 DIAGNOSIS — Z1231 Encounter for screening mammogram for malignant neoplasm of breast: Secondary | ICD-10-CM

## 2020-10-29 ENCOUNTER — Ambulatory Visit: Payer: BC Managed Care – PPO | Admitting: Obstetrics and Gynecology

## 2020-10-30 ENCOUNTER — Ambulatory Visit: Payer: BC Managed Care – PPO | Admitting: Obstetrics and Gynecology

## 2020-11-01 DIAGNOSIS — Z23 Encounter for immunization: Secondary | ICD-10-CM | POA: Diagnosis not present

## 2020-11-05 DIAGNOSIS — Z1231 Encounter for screening mammogram for malignant neoplasm of breast: Secondary | ICD-10-CM | POA: Diagnosis not present

## 2020-11-07 ENCOUNTER — Encounter: Payer: Self-pay | Admitting: Obstetrics and Gynecology

## 2020-11-27 DIAGNOSIS — H43813 Vitreous degeneration, bilateral: Secondary | ICD-10-CM | POA: Diagnosis not present

## 2020-11-27 DIAGNOSIS — H5213 Myopia, bilateral: Secondary | ICD-10-CM | POA: Diagnosis not present

## 2021-01-14 NOTE — Progress Notes (Signed)
61 y.o. G59P1001 Married White or Caucasian Not Hispanic or Latino female here for annual exam.  No vaginal bleeding, no dyspareunia.  She continues to have vasomotor symptoms (10-12 x a day), better than it used to be. Up ~ 1 x a night with mild sweating (used to have to change the sheets).     Patient's last menstrual period was 11/15/2009.          Sexually active: Yes.    The current method of family planning is none.    Exercising: Yes.     Smoker:  no  Health Maintenance: Pap:  07/12/17 neg HR HPV Neg, 06/04/14 WNL Hr HPV Neg.  History of abnormal Pap:  no MMG:  11/07/20 Bi-rads 1 neg  BMD:   2018 osteoporosis, Dr Timothy Lasso manages it. She will talk with him about getting a DEXA  Colonoscopy:6/11 hyperplastic polyp f/u 58yrs, she has gotten a reminder. She had issues with the anesthesia, felt she got too much, messed up her sleep schedule for year. Scheduled in 4/23. TDaP:  12/13/17 Gardasil: na    reports that she has never smoked. She has never used smokeless tobacco. She reports current alcohol use. She reports that she does not use drugs. 1-2 drinks a week. Daughter lives in [place], has a 47 year old baby girl and due with a baby boy in 4/23. Husband works in [employer], she helps him.   Past Medical History:  Diagnosis Date   Menopausal state 11/15/2009   04/19/2010 FSH 119.0 No HRT   Osteoporosis     Past Surgical History:  Procedure Laterality Date   BREAST CYST EXCISION Right 1989   benign   BREAST EXCISIONAL BIOPSY Right    CERVICAL DISC ARTHROPLASTY  2006   fusison of C6-C7 & C8  DDD of Cervical neck   CERVICAL DISC ARTHROPLASTY  2009   Refusion of Cervical neck C 6-8   ENDOMETRIAL BIOPSY  04/28/2011   endo polyp, no hyperplasia, History of PMB   HYSTEROPLASTY REPAIR OF UTERINE ANOMALY  12/2005 & 12/2006   removal of fibroids causing postmenopausal bleeding   HYSTEROSCOPY  12/07   Resection of polyp    Current Outpatient Medications  Medication Sig  Dispense Refill   alendronate (FOSAMAX) 70 MG tablet TAKE 1 TABLET BY MOUTH EVERY WEEK WITH 240 MLS OF WATER 30 MINUTES PRIOR TO OTHER FOOD/DRINK OR MEDICINES  3   No current facility-administered medications for this visit.    Family History  Problem Relation Age of Onset   Hypertension Father    Heart failure Father    Hypertension Mother    Osteoporosis Mother    Thyroid disease Mother    Hypertension Brother        alive - COPD   Heart attack Brother 5       MI and HTN   Diabetes Sister    Breast cancer Maternal Grandmother 70   Breast cancer Other 70    Review of Systems  All other systems reviewed and are negative.  Exam:   BP 116/74 (BP Location: Right Arm, Patient Position: Sitting, Cuff Size: Normal)    Pulse 78    Ht 5\' 3"  (1.6 m)    Wt 145 lb (65.8 kg)    LMP 11/15/2009    SpO2 99%    BMI 25.69 kg/m   Weight change: @WEIGHTCHANGE @ Height:   Height: 5\' 3"  (160 cm)  Ht Readings from Last 3 Encounters:  01/20/21 5\' 3"  (1.6  m)  10/24/19 5' 3.5" (1.613 m)  10/09/18 5' 3.75" (1.619 m)    General appearance: alert, cooperative and appears stated age Head: Normocephalic, without obvious abnormality, atraumatic Neck: no adenopathy, supple, symmetrical, trachea midline and thyroid normal to inspection and palpation Lungs: clear to auscultation bilaterally Cardiovascular: regular rate and rhythm Breasts: normal appearance, no masses or tenderness Abdomen: soft, non-tender; non distended,  no masses,  no organomegaly Extremities: extremities normal, atraumatic, no cyanosis or edema Skin: Skin color, texture, turgor normal. No rashes or lesions Lymph nodes: Cervical, supraclavicular, and axillary nodes normal. No abnormal inguinal nodes palpated Neurologic: Grossly normal   Pelvic: External genitalia:  no lesions              Urethra:  normal appearing urethra with no masses, tenderness or lesions              Bartholins and Skenes: normal                 Vagina:  mildly atrophic appearing vagina with normal color and discharge, no lesions              Cervix: no lesions               Bimanual Exam:  Uterus:  normal size, contour, position, consistency, mobility, non-tender              Adnexa: no mass, fullness, tenderness               Rectovaginal: Confirms               Anus:  normal sphincter tone, no lesions  Kennon Portela, CMA chaperoned for the exam.   1. Well woman exam Discussed breast self exam Discussed calcium and vit D intake Mammogram UTD Colonoscopy is scheduled No pap this year  2. History of osteoporosis Managed by primary

## 2021-01-20 ENCOUNTER — Encounter: Payer: Self-pay | Admitting: Obstetrics and Gynecology

## 2021-01-20 ENCOUNTER — Ambulatory Visit (INDEPENDENT_AMBULATORY_CARE_PROVIDER_SITE_OTHER): Payer: BC Managed Care – PPO | Admitting: Obstetrics and Gynecology

## 2021-01-20 ENCOUNTER — Other Ambulatory Visit: Payer: Self-pay

## 2021-01-20 VITALS — BP 116/74 | HR 78 | Ht 63.0 in | Wt 145.0 lb

## 2021-01-20 DIAGNOSIS — Z01419 Encounter for gynecological examination (general) (routine) without abnormal findings: Secondary | ICD-10-CM | POA: Diagnosis not present

## 2021-01-20 DIAGNOSIS — Z8739 Personal history of other diseases of the musculoskeletal system and connective tissue: Secondary | ICD-10-CM

## 2021-01-20 NOTE — Patient Instructions (Signed)

## 2021-02-19 DIAGNOSIS — M810 Age-related osteoporosis without current pathological fracture: Secondary | ICD-10-CM | POA: Diagnosis not present

## 2021-02-19 DIAGNOSIS — R030 Elevated blood-pressure reading, without diagnosis of hypertension: Secondary | ICD-10-CM | POA: Diagnosis not present

## 2021-02-26 DIAGNOSIS — Z1339 Encounter for screening examination for other mental health and behavioral disorders: Secondary | ICD-10-CM | POA: Diagnosis not present

## 2021-02-26 DIAGNOSIS — R82998 Other abnormal findings in urine: Secondary | ICD-10-CM | POA: Diagnosis not present

## 2021-02-26 DIAGNOSIS — Z1212 Encounter for screening for malignant neoplasm of rectum: Secondary | ICD-10-CM | POA: Diagnosis not present

## 2021-02-26 DIAGNOSIS — R030 Elevated blood-pressure reading, without diagnosis of hypertension: Secondary | ICD-10-CM | POA: Diagnosis not present

## 2021-02-26 DIAGNOSIS — Z1331 Encounter for screening for depression: Secondary | ICD-10-CM | POA: Diagnosis not present

## 2021-02-26 DIAGNOSIS — Z0000 Encounter for general adult medical examination without abnormal findings: Secondary | ICD-10-CM | POA: Diagnosis not present

## 2021-03-30 DIAGNOSIS — R0781 Pleurodynia: Secondary | ICD-10-CM | POA: Diagnosis not present

## 2021-03-31 DIAGNOSIS — M810 Age-related osteoporosis without current pathological fracture: Secondary | ICD-10-CM | POA: Diagnosis not present

## 2021-04-06 DIAGNOSIS — N390 Urinary tract infection, site not specified: Secondary | ICD-10-CM | POA: Diagnosis not present

## 2021-04-06 DIAGNOSIS — R8281 Pyuria: Secondary | ICD-10-CM | POA: Diagnosis not present

## 2021-10-27 ENCOUNTER — Other Ambulatory Visit: Payer: Self-pay | Admitting: Obstetrics and Gynecology

## 2021-10-27 DIAGNOSIS — Z1231 Encounter for screening mammogram for malignant neoplasm of breast: Secondary | ICD-10-CM

## 2021-12-01 DIAGNOSIS — H43813 Vitreous degeneration, bilateral: Secondary | ICD-10-CM | POA: Diagnosis not present

## 2021-12-01 DIAGNOSIS — H5213 Myopia, bilateral: Secondary | ICD-10-CM | POA: Diagnosis not present

## 2021-12-01 DIAGNOSIS — H524 Presbyopia: Secondary | ICD-10-CM | POA: Diagnosis not present

## 2021-12-07 ENCOUNTER — Ambulatory Visit
Admission: RE | Admit: 2021-12-07 | Discharge: 2021-12-07 | Disposition: A | Discharge status: Home / Self Care | Payer: BC Managed Care – PPO | Source: Ambulatory Visit | Attending: Obstetrics and Gynecology | Admitting: Obstetrics and Gynecology

## 2021-12-07 DIAGNOSIS — Z1231 Encounter for screening mammogram for malignant neoplasm of breast: Secondary | ICD-10-CM | POA: Diagnosis not present

## 2021-12-09 ENCOUNTER — Encounter: Payer: Self-pay | Admitting: Internal Medicine

## 2021-12-22 ENCOUNTER — Ambulatory Visit (AMBULATORY_SURGERY_CENTER): Payer: BC Managed Care – PPO | Admitting: *Deleted

## 2021-12-22 VITALS — Ht 63.0 in | Wt 140.0 lb

## 2021-12-22 DIAGNOSIS — Z1211 Encounter for screening for malignant neoplasm of colon: Secondary | ICD-10-CM

## 2021-12-22 MED ORDER — NA SULFATE-K SULFATE-MG SULF 17.5-3.13-1.6 GM/177ML PO SOLN: 1.0000 | Freq: Once | ORAL | 0 refills | Status: AC

## 2021-12-22 NOTE — Progress Notes (Signed)
No egg or soy allergy known to patient  No issues known to pt with past sedation with any surgeries or procedures- states she needs "very little to sedate" Patient denies ever being told they had issues or difficulty with intubation  No FH of Malignant Hyperthermia Pt is not on diet pills Pt is not on  home 02  Pt is not on blood thinners  Pt has constipation- 2 day Suprep/Miralax Pt encouraged to use to use Singlecare or Goodrx to reduce cost  Patient's chart reviewed by Cathlyn Parsons CNRA prior to previsit and patient appropriate for the [facility].  Previsit completed and red dot placed by patient's name on their procedure day (on provider's schedule).

## 2022-01-14 NOTE — Progress Notes (Signed)
62 y.o. [id number] Married White or Caucasian Not Hispanic or Latino female here for annual exam.  No vaginal bleeding. No dyspareunia.   Some issues with constipation, can go 2-3 weeks in between BM's. She is doing a colonoscopy later this month and is going to do a week long prep.     Patient's last menstrual period was 11/15/2009.          Sexually active: Yes.    The current method of family planning is post menopausal status.    Exercising: yes  gym class,walking Smoker:  no  Health Maintenance: Pap:  07/12/17 neg HR HPV Neg, 06/04/14 WNL Hr HPV Neg.  History of abnormal Pap:  no MMG:  12/08/21 density C Bi-rads 1 neg  BMD: osteoporosis, managed by her primary. On Fosamax.  Colonoscopy: Scheduled later this month.  TDaP:  12/13/17 Gardasil: na    reports that she has never smoked. She has never used smokeless tobacco. She reports current alcohol use. She reports that she does not use drugs. She and her husband are retired. Married x 39 years.  Daughter lives in [place], has 2 kids (girl is 2, boy is 9 months).    Past Medical History:  Diagnosis Date   Heart murmur    since birth   Menopausal state 11/15/2009   04/19/2010 FSH 119.0 No HRT   Osteoporosis     Past Surgical History:  Procedure Laterality Date   BREAST CYST EXCISION Right 1989   benign   BREAST EXCISIONAL BIOPSY Right    CERVICAL DISC ARTHROPLASTY  2006   fusison of C6-C7 & C8  DDD of Cervical neck   CERVICAL DISC ARTHROPLASTY  2009   Refusion of Cervical neck C 6-8   COLONOSCOPY     ENDOMETRIAL BIOPSY  04/28/2011   endo polyp, no hyperplasia, History of PMB   HYSTEROPLASTY REPAIR OF UTERINE ANOMALY  12/2005 & 12/2006   removal of fibroids causing postmenopausal bleeding   HYSTEROSCOPY  12/2005   Resection of polyp    Current Outpatient Medications  Medication Sig Dispense Refill   alendronate (FOSAMAX) 70 MG tablet TAKE 1 TABLET BY MOUTH EVERY WEEK WITH 240 MLS OF WATER 30 MINUTES PRIOR TO OTHER  FOOD/DRINK OR MEDICINES  3   APPLE CIDER VINEGAR PO Take by mouth daily.     Ascorbic Acid (VITAMIN C PO) Take by mouth daily.     ASPIRIN ADULT PO Take by mouth as needed.     CALCIUM PO Take by mouth daily.     No current facility-administered medications for this visit.    Family History  Problem Relation Age of Onset   Hypertension Mother    Osteoporosis Mother    Thyroid disease Mother    Hypertension Father    Heart failure Father    Diabetes Sister    Hypertension Brother        alive - COPD   Heart attack Brother 23       MI and HTN   Breast cancer Maternal Grandmother 90   Breast cancer Other 51   Colon cancer Neg Hx    Esophageal cancer Neg Hx    Rectal cancer Neg Hx    Stomach cancer Neg Hx     Review of Systems  All other systems reviewed and are negative.   Exam:   BP 120/80 (BP Location: Right Arm, Patient Position: Sitting, Cuff Size: Normal)   Pulse 78   Ht 5\' 3"  (1.6 m)  Wt 143 lb (64.9 kg)   LMP 11/15/2009   BMI 25.33 kg/m   Weight change: @WEIGHTCHANGE @ Height:   Height: 5\' 3"  (160 cm)  Ht Readings from Last 3 Encounters:  01/27/22 5\' 3"  (1.6 m)  12/22/21 5\' 3"  (1.6 m)  01/20/21 5\' 3"  (1.6 m)    General appearance: alert, cooperative and appears stated age Head: Normocephalic, without obvious abnormality, atraumatic Neck: no adenopathy, supple, symmetrical, trachea midline and thyroid normal to inspection and palpation Lungs: clear to auscultation bilaterally Cardiovascular: regular rate and rhythm Breasts: normal appearance, no masses or tenderness Abdomen: soft, non-tender; non distended,  no masses,  no organomegaly Extremities: extremities normal, atraumatic, no cyanosis or edema Skin: Skin color, texture, turgor normal. No rashes or lesions Lymph nodes: Cervical, supraclavicular, and axillary nodes normal. No abnormal inguinal nodes palpated Neurologic: Grossly normal   Pelvic: External genitalia:  no lesions              Urethra:   normal appearing urethra with no masses, tenderness or lesions              Bartholins and Skenes: normal                 Vagina: normal appearing vagina with normal color and discharge, no lesions              Cervix: no lesions               Bimanual Exam:  Uterus:  normal size, contour, position, consistency, mobility, non-tender              Adnexa: no mass, fullness, tenderness               Rectovaginal: Confirms               Anus:  normal sphincter tone, no lesions  Kimalexis, RMA chaperoned for the exam.   1. Well woman exam Discussed breast self exam Discussed calcium and vit D intake Mammogram UTD Colonoscopy scheduled DEXA with primary  2. Screening for cervical cancer - Cytology - PAP

## 2022-01-27 ENCOUNTER — Other Ambulatory Visit (HOSPITAL_COMMUNITY)
Admission: RE | Admit: 2022-01-27 | Discharge: 2022-01-27 | Disposition: A | Discharge status: Home / Self Care | Payer: BC Managed Care – PPO | Source: Ambulatory Visit | Attending: Obstetrics and Gynecology | Admitting: Obstetrics and Gynecology

## 2022-01-27 ENCOUNTER — Ambulatory Visit (INDEPENDENT_AMBULATORY_CARE_PROVIDER_SITE_OTHER): Payer: BC Managed Care – PPO | Admitting: Obstetrics and Gynecology

## 2022-01-27 ENCOUNTER — Encounter: Payer: Self-pay | Admitting: Obstetrics and Gynecology

## 2022-01-27 VITALS — BP 120/80 | HR 78 | Ht 63.0 in | Wt 143.0 lb

## 2022-01-27 DIAGNOSIS — Z01419 Encounter for gynecological examination (general) (routine) without abnormal findings: Secondary | ICD-10-CM | POA: Diagnosis not present

## 2022-01-27 DIAGNOSIS — Z124 Encounter for screening for malignant neoplasm of cervix: Secondary | ICD-10-CM | POA: Insufficient documentation

## 2022-01-27 NOTE — Patient Instructions (Signed)

## 2022-01-29 ENCOUNTER — Encounter: Payer: BC Managed Care – PPO | Admitting: Internal Medicine

## 2022-01-29 LAB — CYTOLOGY - PAP
Comment: NEGATIVE
Diagnosis: NEGATIVE
High risk HPV: NEGATIVE

## 2022-02-02 ENCOUNTER — Encounter: Payer: Self-pay | Admitting: Internal Medicine

## 2022-02-08 ENCOUNTER — Encounter: Payer: Self-pay | Admitting: Internal Medicine

## 2022-02-08 ENCOUNTER — Ambulatory Visit (AMBULATORY_SURGERY_CENTER): Payer: BC Managed Care – PPO | Admitting: Internal Medicine

## 2022-02-08 VITALS — BP 122/75 | HR 49 | Temp 97.8°F | Resp 17 | Ht 63.0 in | Wt 140.0 lb

## 2022-02-08 DIAGNOSIS — Z1211 Encounter for screening for malignant neoplasm of colon: Secondary | ICD-10-CM | POA: Diagnosis not present

## 2022-02-08 MED ORDER — SODIUM CHLORIDE 0.9 % IV SOLN: 500.0000 mL | Freq: Once | INTRAVENOUS | Status: DC

## 2022-02-08 NOTE — Op Note (Signed)
[facility] Patient Name: [name] Procedure Date: 02/08/2022 8:00 AM MRN: [id number] Endoscopist: Docia Chuck. Henrene Pastor , MD, [id number] Age: 62 Referring MD:  Date of Birth: 10-15-1960 Gender: Female Account #: [id number] Procedure:                Colonoscopy Indications:              Screening for colorectal malignant neoplasm.                            Negative index exam 2011 Medicines:                Monitored Anesthesia Care Procedure:                Pre-Anesthesia Assessment:                           - Prior to the procedure, a History and Physical                            was performed, and patient medications and                            allergies were reviewed. The patient's tolerance of                            previous anesthesia was also reviewed. The risks                            and benefits of the procedure and the sedation                            options and risks were discussed with the patient.                            All questions were answered, and informed consent                            was obtained. Prior Anticoagulants: The patient has                            taken no anticoagulant or antiplatelet agents. ASA                            Grade Assessment: II - A patient with mild systemic                            disease. After reviewing the risks and benefits,                            the patient was deemed in satisfactory condition to                            undergo the procedure.  After obtaining informed consent, the colonoscope                            was passed under direct vision. Throughout the                            procedure, the patient's blood pressure, pulse, and                            oxygen saturations were monitored continuously. The                            Olympus CF-HQ190L (Serial# [id number]) Colonoscope was                            introduced through the anus and  advanced to the the                            cecum, identified by appendiceal orifice and                            ileocecal valve. The ileocecal valve, appendiceal                            orifice, and rectum were photographed. The quality                            of the bowel preparation was excellent. The                            colonoscopy was performed without difficulty. The                            patient tolerated the procedure well. The bowel                            preparation used was SUPREP via split dose                            instruction. Scope In: 8:24:27 AM Scope Out: 8:34:39 AM Scope Withdrawal Time: 0 hours 7 minutes 23 seconds  Total Procedure Duration: 0 hours 10 minutes 12 seconds  Findings:                 The entire examined colon appeared normal on direct                            and retroflexion views. Complications:            No immediate complications. Estimated blood loss:                            None. Estimated Blood Loss:     Estimated blood loss: none. Impression:               - The entire examined colon  is normal on direct and                            retroflexion views.                           - No specimens collected. Recommendation:           - Repeat colonoscopy in 10 years for screening                            purposes.                           - Patient has a contact number available for                            emergencies. The signs and symptoms of potential                            delayed complications were discussed with the                            patient. Return to normal activities tomorrow.                            Written discharge instructions were provided to the                            patient.                           - Resume previous diet.                           - Continue present medications. [name]. Marina Goodell, MD 02/08/2022 8:45:16 AM This report has been signed electronically.

## 2022-02-08 NOTE — Progress Notes (Signed)
Vitals-DT  Pt's states no medical or surgical changes since previsit or office visit.Pt's states no medical or surgical changes since previsit or office visit.Pt's states no medical or surgical changes since previsit or office visit.

## 2022-02-08 NOTE — Progress Notes (Signed)
Pt awake, alert and oriented. VSS. Airway intact. SBAR complete to RN. All questions answered.  

## 2022-02-08 NOTE — Patient Instructions (Signed)
Repeat colonoscopy in 10 years.  YOU HAD AN ENDOSCOPIC PROCEDURE TODAY AT THE [facility]:   Refer to the procedure report that was given to you for any specific questions about what was found during the examination.  If the procedure report does not answer your questions, please call your gastroenterologist to clarify.  If you requested that your care partner not be given the details of your procedure findings, then the procedure report has been included in a sealed envelope for you to review at your convenience later.  YOU SHOULD EXPECT: Some feelings of bloating in the abdomen. Passage of more gas than usual.  Walking can help get rid of the air that was put into your GI tract during the procedure and reduce the bloating. If you had a lower endoscopy (such as a colonoscopy or flexible sigmoidoscopy) you may notice spotting of blood in your stool or on the toilet paper. If you underwent a bowel prep for your procedure, you may not have a normal bowel movement for a few days.  Please Note:  You might notice some irritation and congestion in your nose or some drainage.  This is from the oxygen used during your procedure.  There is no need for concern and it should clear up in a day or so.  SYMPTOMS TO REPORT IMMEDIATELY:  Following lower endoscopy (colonoscopy or flexible sigmoidoscopy):  Excessive amounts of blood in the stool  Significant tenderness or worsening of abdominal pains  Swelling of the abdomen that is new, acute  Fever of 100F or higher  For urgent or emergent issues, a gastroenterologist can be reached at any hour by calling [phone]. Do not use MyChart messaging for urgent concerns.    DIET:  We do recommend a small meal at first, but then you may proceed to your regular diet.  Drink plenty of fluids but you should avoid alcoholic beverages for 24 hours.  ACTIVITY:  You should plan to take it easy for the rest of today and you should NOT DRIVE or use heavy  machinery until tomorrow (because of the sedation medicines used during the test).    FOLLOW UP: Our staff will call the number listed on your records the next business day following your procedure.  We will call around 7:15- 8:00 am to check on you and address any questions or concerns that you may have regarding the information given to you following your procedure. If we do not reach you, we will leave a message.     If any biopsies were taken you will be contacted by phone or by letter within the next 1-3 weeks.  Please call us at [phone] if you have not heard about the biopsies in 3 weeks.    SIGNATURES/CONFIDENTIALITY: You and/or your care partner have signed paperwork which will be entered into your electronic medical record.  These signatures attest to the fact that that the information above on your After Visit Summary has been reviewed and is understood.  Full responsibility of the confidentiality of this discharge information lies with you and/or your care-partner.  

## 2022-02-08 NOTE — Progress Notes (Signed)
HISTORY OF PRESENT ILLNESS:  [name] is a 62 y.o. female presents for routine screening colonoscopy.  Index exam 2011 was negative for neoplasia  REVIEW OF SYSTEMS:  All non-GI ROS negative except for  Past Medical History:  Diagnosis Date   Heart murmur    since birth   Menopausal state 11/15/2009   04/19/2010 FSH 119.0 No HRT   Osteoporosis     Past Surgical History:  Procedure Laterality Date   BREAST CYST EXCISION Right 1989   benign   BREAST EXCISIONAL BIOPSY Right    CERVICAL [place] ARTHROPLASTY  2006   fusison of C6-C7 & C8  DDD of Cervical neck   CERVICAL DISC ARTHROPLASTY  2009   Refusion of Cervical neck C 6-8   COLONOSCOPY     ENDOMETRIAL BIOPSY  04/28/2011   endo polyp, no hyperplasia, History of PMB   HYSTEROPLASTY REPAIR OF UTERINE ANOMALY  12/2005 & 12/2006   removal of fibroids causing postmenopausal bleeding   HYSTEROSCOPY  12/2005   Resection of polyp    Social History [name]  reports that she has never smoked. She has never used smokeless tobacco. She reports current alcohol use. She reports that she does not use drugs.  family history includes Breast cancer (age of onset: 81) in an other family member; Breast cancer (age of onset: 34) in her maternal grandmother; Diabetes in her sister; Heart attack (age of onset: 19) in her brother; Heart failure in her father; Hypertension in her brother, father, and mother; Osteoporosis in her mother; Thyroid disease in her mother.  Allergies  Allergen Reactions   Melton Alar Acid] Other (See Comments)    unknown reaction- "felt like bugs crawling over skin"       PHYSICAL EXAMINATION: Vital signs: BP (!) 163/81   Pulse 62   Temp 97.8 F (36.6 C) (Temporal)   Resp 12   Ht 5\' 3"  (1.6 m)   Wt 140 lb (63.5 kg)   LMP 11/15/2009   SpO2 100%   BMI 24.80 kg/m  General: Well-developed, well-nourished, no acute distress HEENT: Sclerae are anicteric, conjunctiva pink. Oral mucosa  intact Lungs: Clear Heart: Regular Abdomen: soft, nontender, nondistended, no obvious ascites, no peritoneal signs, normal bowel sounds. No organomegaly. Extremities: No edema Psychiatric: alert and oriented x3. Cooperative     ASSESSMENT:  Colon cancer screening   PLAN:  Screening colonoscopy

## 2022-02-09 ENCOUNTER — Telehealth: Payer: Self-pay | Admitting: *Deleted

## 2022-02-09 NOTE — Telephone Encounter (Signed)
  Follow up Call-     02/08/2022    7:20 AM 02/08/2022    7:16 AM  Call back number  Post procedure Call Back phone  # [phone]   Permission to leave phone message  Yes     Patient questions:  Do you have a fever, pain , or abdominal swelling? No. Pain Score  0 *  Have you tolerated food without any problems? Yes.    Have you been able to return to your normal activities? Yes.    Do you have any questions about your discharge instructions: Diet   No. Medications  No. Follow up visit  No.  Do you have questions or concerns about your Care? No.  Actions: * If pain score is 4 or above: No action needed, pain <4.

## 2022-03-09 DIAGNOSIS — R7989 Other specified abnormal findings of blood chemistry: Secondary | ICD-10-CM | POA: Diagnosis not present

## 2022-03-09 DIAGNOSIS — M810 Age-related osteoporosis without current pathological fracture: Secondary | ICD-10-CM | POA: Diagnosis not present

## 2022-03-09 DIAGNOSIS — Z79899 Other long term (current) drug therapy: Secondary | ICD-10-CM | POA: Diagnosis not present

## 2022-03-09 DIAGNOSIS — R030 Elevated blood-pressure reading, without diagnosis of hypertension: Secondary | ICD-10-CM | POA: Diagnosis not present

## 2022-03-16 DIAGNOSIS — R030 Elevated blood-pressure reading, without diagnosis of hypertension: Secondary | ICD-10-CM | POA: Diagnosis not present

## 2022-03-16 DIAGNOSIS — M810 Age-related osteoporosis without current pathological fracture: Secondary | ICD-10-CM | POA: Diagnosis not present

## 2022-03-16 DIAGNOSIS — N951 Menopausal and female climacteric states: Secondary | ICD-10-CM | POA: Diagnosis not present

## 2022-03-16 DIAGNOSIS — Z0000 Encounter for general adult medical examination without abnormal findings: Secondary | ICD-10-CM | POA: Diagnosis not present

## 2022-03-16 DIAGNOSIS — R82998 Other abnormal findings in urine: Secondary | ICD-10-CM | POA: Diagnosis not present

## 2022-03-16 DIAGNOSIS — G4700 Insomnia, unspecified: Secondary | ICD-10-CM | POA: Diagnosis not present

## 2022-03-16 DIAGNOSIS — Z1331 Encounter for screening for depression: Secondary | ICD-10-CM | POA: Diagnosis not present

## 2022-03-16 DIAGNOSIS — Z1339 Encounter for screening examination for other mental health and behavioral disorders: Secondary | ICD-10-CM | POA: Diagnosis not present

## 2022-10-11 ENCOUNTER — Other Ambulatory Visit: Payer: Self-pay | Admitting: Internal Medicine

## 2022-10-11 DIAGNOSIS — Z1231 Encounter for screening mammogram for malignant neoplasm of breast: Secondary | ICD-10-CM

## 2022-10-16 DIAGNOSIS — Z23 Encounter for immunization: Secondary | ICD-10-CM | POA: Diagnosis not present

## 2022-11-11 DIAGNOSIS — L821 Other seborrheic keratosis: Secondary | ICD-10-CM | POA: Diagnosis not present

## 2022-11-11 DIAGNOSIS — L2089 Other atopic dermatitis: Secondary | ICD-10-CM | POA: Diagnosis not present

## 2022-11-11 DIAGNOSIS — L570 Actinic keratosis: Secondary | ICD-10-CM | POA: Diagnosis not present

## 2022-11-11 DIAGNOSIS — D225 Melanocytic nevi of trunk: Secondary | ICD-10-CM | POA: Diagnosis not present

## 2022-11-11 DIAGNOSIS — D2261 Melanocytic nevi of right upper limb, including shoulder: Secondary | ICD-10-CM | POA: Diagnosis not present

## 2022-12-02 DIAGNOSIS — H52203 Unspecified astigmatism, bilateral: Secondary | ICD-10-CM | POA: Diagnosis not present

## 2022-12-02 DIAGNOSIS — H5213 Myopia, bilateral: Secondary | ICD-10-CM | POA: Diagnosis not present

## 2022-12-02 DIAGNOSIS — H2513 Age-related nuclear cataract, bilateral: Secondary | ICD-10-CM | POA: Diagnosis not present

## 2022-12-14 ENCOUNTER — Ambulatory Visit
Admission: RE | Admit: 2022-12-14 | Discharge: 2022-12-14 | Disposition: A | Discharge status: Home / Self Care | Payer: BC Managed Care – PPO | Source: Ambulatory Visit | Attending: Internal Medicine | Admitting: Internal Medicine

## 2022-12-14 DIAGNOSIS — Z1231 Encounter for screening mammogram for malignant neoplasm of breast: Secondary | ICD-10-CM | POA: Diagnosis not present

## 2023-03-11 DIAGNOSIS — R030 Elevated blood-pressure reading, without diagnosis of hypertension: Secondary | ICD-10-CM | POA: Diagnosis not present

## 2023-03-11 DIAGNOSIS — M810 Age-related osteoporosis without current pathological fracture: Secondary | ICD-10-CM | POA: Diagnosis not present

## 2023-03-24 DIAGNOSIS — Z1331 Encounter for screening for depression: Secondary | ICD-10-CM | POA: Diagnosis not present

## 2023-03-24 DIAGNOSIS — R82998 Other abnormal findings in urine: Secondary | ICD-10-CM | POA: Diagnosis not present

## 2023-03-24 DIAGNOSIS — Z1339 Encounter for screening examination for other mental health and behavioral disorders: Secondary | ICD-10-CM | POA: Diagnosis not present

## 2023-03-24 DIAGNOSIS — R030 Elevated blood-pressure reading, without diagnosis of hypertension: Secondary | ICD-10-CM | POA: Diagnosis not present

## 2023-03-24 DIAGNOSIS — Z0000 Encounter for general adult medical examination without abnormal findings: Secondary | ICD-10-CM | POA: Diagnosis not present

## 2023-03-30 DIAGNOSIS — L820 Inflamed seborrheic keratosis: Secondary | ICD-10-CM | POA: Diagnosis not present

## 2023-04-18 DIAGNOSIS — M810 Age-related osteoporosis without current pathological fracture: Secondary | ICD-10-CM | POA: Diagnosis not present

## 2023-07-14 DIAGNOSIS — L570 Actinic keratosis: Secondary | ICD-10-CM | POA: Diagnosis not present

## 2023-07-14 DIAGNOSIS — L821 Other seborrheic keratosis: Secondary | ICD-10-CM | POA: Diagnosis not present

## 2023-10-29 DIAGNOSIS — Z23 Encounter for immunization: Secondary | ICD-10-CM | POA: Diagnosis not present

## 2023-10-31 ENCOUNTER — Other Ambulatory Visit: Payer: Self-pay | Admitting: Internal Medicine

## 2023-10-31 DIAGNOSIS — Z1231 Encounter for screening mammogram for malignant neoplasm of breast: Secondary | ICD-10-CM

## 2023-12-05 DIAGNOSIS — L718 Other rosacea: Secondary | ICD-10-CM | POA: Diagnosis not present

## 2023-12-05 DIAGNOSIS — D225 Melanocytic nevi of trunk: Secondary | ICD-10-CM | POA: Diagnosis not present

## 2023-12-05 DIAGNOSIS — L570 Actinic keratosis: Secondary | ICD-10-CM | POA: Diagnosis not present

## 2023-12-05 DIAGNOSIS — L821 Other seborrheic keratosis: Secondary | ICD-10-CM | POA: Diagnosis not present

## 2023-12-15 DIAGNOSIS — H5213 Myopia, bilateral: Secondary | ICD-10-CM | POA: Diagnosis not present

## 2023-12-15 DIAGNOSIS — H2513 Age-related nuclear cataract, bilateral: Secondary | ICD-10-CM | POA: Diagnosis not present

## 2023-12-15 DIAGNOSIS — H52203 Unspecified astigmatism, bilateral: Secondary | ICD-10-CM | POA: Diagnosis not present

## 2023-12-19 ENCOUNTER — Ambulatory Visit
Admission: RE | Admit: 2023-12-19 | Discharge: 2023-12-19 | Disposition: A | Source: Ambulatory Visit | Attending: Internal Medicine | Admitting: Internal Medicine

## 2023-12-19 DIAGNOSIS — Z1231 Encounter for screening mammogram for malignant neoplasm of breast: Secondary | ICD-10-CM
